# Patient Record
Sex: Female | Born: 1985 | Race: Black or African American | Marital: Single | Smoking: Current every day smoker
Health system: Northeastern US, Academic
[De-identification: ages and names within clinical notes are randomized; demographics above are authoritative.]

## PROBLEM LIST (undated history)

## (undated) DIAGNOSIS — Z789 Other specified health status: Secondary | ICD-10-CM

## (undated) DIAGNOSIS — J302 Other seasonal allergic rhinitis: Secondary | ICD-10-CM

## (undated) DIAGNOSIS — Z8619 Personal history of other infectious and parasitic diseases: Secondary | ICD-10-CM

## (undated) DIAGNOSIS — T1490XA Injury, unspecified, initial encounter: Secondary | ICD-10-CM

## (undated) DIAGNOSIS — M419 Scoliosis, unspecified: Secondary | ICD-10-CM

## (undated) DIAGNOSIS — Z62810 Personal history of physical and sexual abuse in childhood: Secondary | ICD-10-CM

## (undated) DIAGNOSIS — A6009 Herpesviral infection of other urogenital tract: Secondary | ICD-10-CM

## (undated) DIAGNOSIS — A64 Unspecified sexually transmitted disease: Secondary | ICD-10-CM

## (undated) DIAGNOSIS — A6000 Herpesviral infection of urogenital system, unspecified: Secondary | ICD-10-CM

## (undated) HISTORY — DX: Herpesviral infection of urogenital system, unspecified: A60.00

## (undated) HISTORY — DX: Unspecified sexually transmitted disease: A64

## (undated) HISTORY — PX: TUBAL LIGATION: SHX77

## (undated) HISTORY — DX: Personal history of other infectious and parasitic diseases: Z86.19

## (undated) HISTORY — DX: Scoliosis, unspecified: M41.9

## (undated) HISTORY — DX: Injury, unspecified, initial encounter: T14.90XA

## (undated) HISTORY — DX: Personal history of physical and sexual abuse in childhood: Z62.810

## (undated) HISTORY — DX: Herpesviral infection of other urogenital tract: A60.09

## (undated) HISTORY — DX: Other seasonal allergic rhinitis: J30.2

## (undated) HISTORY — DX: Other specified health status: Z78.9

---
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
## 2006-04-07 DIAGNOSIS — Z13228 Encounter for screening for other metabolic disorders: Secondary | ICD-10-CM

## 2006-04-07 HISTORY — DX: Encounter for screening for other metabolic disorders: Z13.228

## 2007-04-08 DIAGNOSIS — A749 Chlamydial infection, unspecified: Secondary | ICD-10-CM

## 2007-04-08 HISTORY — DX: Chlamydial infection, unspecified: A74.9

## 2009-06-15 ENCOUNTER — Ambulatory Visit: Payer: Self-pay | Admitting: Obstetrics and Gynecology

## 2009-06-29 ENCOUNTER — Ambulatory Visit: Payer: Self-pay | Admitting: Obstetrics and Gynecology

## 2009-07-09 ENCOUNTER — Ambulatory Visit: Payer: Self-pay | Admitting: Obstetrics and Gynecology

## 2009-07-09 ENCOUNTER — Ambulatory Visit
Admit: 2009-07-09 | Discharge: 2009-07-09 | Disposition: A | Discharge status: Home / Self Care | Payer: Self-pay | Source: Ambulatory Visit | Attending: Obstetrics and Gynecology | Admitting: Obstetrics and Gynecology

## 2009-07-12 LAB — N. GONORRHOEAE DNA AMPLIFICATION

## 2009-07-12 LAB — CHLAMYDIA PLASMID DNA AMPLIFICATION

## 2009-07-27 ENCOUNTER — Encounter: Payer: Self-pay | Admitting: Internal Medicine

## 2009-08-01 ENCOUNTER — Encounter: Payer: Self-pay | Admitting: Gastroenterology

## 2009-08-09 ENCOUNTER — Ambulatory Visit: Payer: Self-pay | Admitting: Dermatology

## 2009-08-17 ENCOUNTER — Ambulatory Visit: Payer: Self-pay | Admitting: Obstetrics and Gynecology

## 2009-08-30 ENCOUNTER — Ambulatory Visit: Payer: Self-pay | Admitting: Dermatology

## 2009-09-08 NOTE — Progress Notes (Addendum)
Progress notes   Consulting Physician: [facility].  CC:  Psoriasis.  HPI:  24 year old Afro-American female here for evaluation and treatment   options for what she believes is psoriasis.  She sent in consultation from   the [facility].  Her mother has psoriasis.  Her mother has   received injection therapy for this.  She states that the lesions were   itchy, however use of moisturizer has helped that.     ROS:   Otherwise well.   --No other skin concerns      Past Medical/Surgical History:  Intake form reviewed     Family History:    Intake form reviewed        Social History:    Intake form reviewed        Exam:    --Well-nourished, well-developed, no acute distress  --All of the following were examined and were within normal limits, except   as noted:     -- Face, Neck, Scalp: There are small acneiform papules on the forehead.   -- Chest, Back, Abdomen:      --Right/Left Arms/Hands:  There are small follicular hyperkeratotic papules   on the posterior aspects of both arms.  --Right/Left Legs/Feet: 2 scaly pink plaques on the left knee.  There are   small scaly pink papules within the patient's tattoo on the left side.     Barriers to learning:  None    Assessment/Plan:   1.  Plaque psoriasis, very limited involvement.  Diagnosis and treatment options discussed with the patient.  Triamcinolone 0.1% ointment to be applied twice daily to any affected area.  Patient will followup in one month to make sure that she is doing well.     2.  Keratosis pilaris on the arms.  Diagnosis and treatment options discussed with the patient.  Recommend AmLactin or Lac-Hydrin cream to be applied once to twice daily.    Discussed with the patient that this likely will recur upon discontinuation   of the medications.     3.  Acne on the forehead.  The patient uses hair grease on her frontal scalp regularly, which is   likely causing his acne.  She is not particularly bothered by it.  Treatment will be deferred  at this   time.  Vital Signs   Recorded by HATHAWAY,MARIELLE on 30 Aug 2009 02:58 PM  BP:120/72,   Pain Scale: 0.  Current Meds   MetroNIDAZOLE 500 MG Tablet;take 1 tablet by mouth twice a day for 7 days;   RPT  Doxycycline Hyclate 100 MG Capsule;take 1 capsule by mouth twice a day for   7 days; RPT  Valtrex 500 MG Tablet;TABLET BY MOUTH TWICE A DAY; RPT  Tindamax 500 MG Tablet;TAKE 4 TABLETS BY MOUTH EVERY DAY FOR 2 DAYS; RPT.  Attestation   I saw and evaluated the patient.  I agree with the resident's findings and   plan of care as documented above.         [place], 09/08/2009,  1:17PM.  Signature   Electronically signed by: Miquel Dunn  MD - Res.; 08/30/2009 3:36 PM   EST.  Electronically signed by: Hermine Messick  MD Attend.; 09/08/2009 1:17 PM   EST.

## 2009-09-23 ENCOUNTER — Ambulatory Visit
Admit: 2009-09-23 | Discharge: 2009-09-23 | Disposition: A | Discharge status: Home / Self Care | Payer: Self-pay | Source: Ambulatory Visit | Attending: Obstetrics and Gynecology | Admitting: Obstetrics and Gynecology

## 2009-09-23 ENCOUNTER — Ambulatory Visit: Payer: Self-pay | Admitting: Obstetrics and Gynecology

## 2009-09-23 DIAGNOSIS — Z6835 Body mass index (BMI) 35.0-35.9, adult: Secondary | ICD-10-CM

## 2009-09-23 HISTORY — DX: Body mass index (BMI) 35.0-35.9, adult: Z68.35

## 2009-09-23 LAB — TSH: TSH: 1.57 u[IU]/mL (ref 0.27–4.20)

## 2009-09-24 LAB — N. GONORRHOEAE DNA AMPLIFICATION: N. gonorrhoeae DNA Amplification: 0

## 2009-09-24 LAB — RPR: RPR Screen: NONREACTIVE

## 2009-09-24 LAB — CHLAMYDIA PLASMID DNA AMPLIFICATION: Chlamydia Plasmid DNA Amplification: 0

## 2009-09-27 LAB — VITAMIN D
25-OH VIT D2: <4 ng/mL
25-OH VIT D3: 27 ng/mL
25-OH Vit Total: 27 ng/mL — ABNORMAL LOW (ref 30–80)

## 2009-09-28 LAB — GYN CYTOLOGY

## 2009-10-11 ENCOUNTER — Ambulatory Visit: Payer: Self-pay | Admitting: Dermatology

## 2009-10-22 ENCOUNTER — Ambulatory Visit: Payer: Self-pay | Admitting: Orthopedic Surgery

## 2009-11-12 ENCOUNTER — Ambulatory Visit: Payer: Self-pay | Admitting: Obstetrics and Gynecology

## 2010-01-24 ENCOUNTER — Ambulatory Visit: Payer: Self-pay | Admitting: Obstetrics and Gynecology

## 2010-02-11 ENCOUNTER — Ambulatory Visit: Payer: Self-pay | Admitting: Obstetrics and Gynecology

## 2010-03-07 ENCOUNTER — Ambulatory Visit
Admit: 2010-03-07 | Discharge: 2010-03-07 | Disposition: A | Discharge status: Home / Self Care | Payer: Self-pay | Source: Ambulatory Visit | Attending: Obstetrics and Gynecology | Admitting: Obstetrics and Gynecology

## 2010-03-07 ENCOUNTER — Ambulatory Visit: Payer: Self-pay | Admitting: Obstetrics and Gynecology

## 2010-03-08 LAB — N. GONORRHOEAE DNA AMPLIFICATION: N. gonorrhoeae DNA Amplification: 0

## 2010-03-08 LAB — CHLAMYDIA PLASMID DNA AMPLIFICATION: Chlamydia Plasmid DNA Amplification: 0

## 2010-04-18 ENCOUNTER — Ambulatory Visit: Payer: Self-pay | Admitting: Orthopedic Surgery

## 2010-04-26 ENCOUNTER — Ambulatory Visit: Payer: Self-pay | Admitting: Orthopedic Surgery

## 2010-05-04 ENCOUNTER — Ambulatory Visit: Payer: Self-pay | Admitting: Certified Nurse Midwife

## 2010-05-12 ENCOUNTER — Ambulatory Visit: Payer: Self-pay | Admitting: Advanced Practice Midwife

## 2010-05-15 NOTE — Miscellaneous (Unsigned)
Continuity of Care Record  Created: todo  From: ,   From:   From: [employer], EHR v10.2.7.53  To: Saxer, Tyjae  Purpose: Patient Use;       Medications  Doxycycline Hyclate 100 MG Capsule; take 1 capsule by mouth twice a day for   7 days ; RPT   MetroNIDAZOLE 500 MG Tablet; take 1 tablet by mouth twice a day for 7 days   ; RPT   Tindamax 500 MG Tablet; TAKE 4 TABLETS BY MOUTH EVERY DAY FOR 2 DAYS ; RPT   Triamcinolone Acetonide 0.1 % Ointment; APPLY SPARINGLY AND RUB IN WELL TO   AFFECTED AREA(S) TWICE DAILY. NOT FOR FACE, BREAST OR GROIN. ; Rx   Valtrex 500 MG Tablet; TABLET BY MOUTH TWICE A DAY ; RPT

## 2010-05-19 ENCOUNTER — Ambulatory Visit: Payer: Self-pay | Admitting: Advanced Practice Midwife

## 2010-06-17 ENCOUNTER — Ambulatory Visit: Payer: Self-pay | Admitting: Orthopedic Surgery

## 2010-06-24 ENCOUNTER — Ambulatory Visit: Payer: Self-pay | Admitting: Orthopedic Surgery

## 2010-06-25 NOTE — Progress Notes (Signed)
 Regina Gates is now 25 years of age.  She comes in for follow-up of scoliosis and  back pain. 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  She has a son at home.  The patient reports  that she has pain in her middle back and also pain in the left side of her  low back. The pain does not radiate.  She has been otherwise healthy.  She  is not taking any regular medications.  She does smoke but understands that  she should quit to protect a number of aspects of her health. He pain does  not radiate.    PHYSICAL EXAMINATION: Shoulders and pelvis are level on forward bending.  She does not have a prominent rotational abnormality. She did not have  tenderness to palpation.    X-RAYS: X-rays were taken today and compared with x-rays taken in the past.  This curve has not changed. I measured her curve today from T5 to T11 is 28  degrees to the right, curve from T11 to L4 37 degrees to the left.    The patient and I discussed that the best thing to do for her back pain  probably as a first step are some anti-inflammatory medications.  I  provided her a prescription for naproxen.  I have also asked her to do some  physical therapy and I have provided her with a prescription.  The patient  indicates to me that she has tried to get a breast reduction done through  her insurance and Dr. Daron Offer.  This apparently was refused.  I  explained to her that this was outside my area of expertise.  I did suggest  that it would certainly be valuable to try to lose weight and do therapy  and that this might allow her to make a better argument with her insurance  company. She understands it would probably be very valuable for her to get  a primary care physician so she could pursue this agenda with the primary  care's help. At this point, I do not have specific further recommendations.  I left it that that comes back on an as needed basis.                Electronically Signed and  Finalized  by  Danton Sewer, MD 06/27/2010 11:31  ___________________________________________  Danton Sewer, MD      DD:   06/24/2010  DT:   06/25/2010 12:03 P  ZOX/[id number]  [id number]    cc:

## 2010-07-05 ENCOUNTER — Ambulatory Visit: Payer: Self-pay | Admitting: Obstetrics and Gynecology

## 2010-07-19 ENCOUNTER — Ambulatory Visit: Payer: Self-pay | Admitting: Obstetrics and Gynecology

## 2010-09-29 ENCOUNTER — Ambulatory Visit: Payer: Self-pay | Admitting: Obstetrics and Gynecology

## 2010-10-14 ENCOUNTER — Telehealth: Payer: Self-pay

## 2010-10-14 NOTE — Telephone Encounter (Signed)
 T.C. From pt requesting an annual and was informed appts are being booked in July. Pt then requesting gyn office visit because of pain with IC. Pt agreeable to go to any office there is an opening. Call transferred to sec to schedule next available appt.

## 2010-12-13 ENCOUNTER — Encounter: Payer: Self-pay | Admitting: Obstetrics and Gynecology

## 2010-12-13 DIAGNOSIS — Z975 Presence of (intrauterine) contraceptive device: Secondary | ICD-10-CM

## 2010-12-15 DIAGNOSIS — B009 Herpesviral infection, unspecified: Secondary | ICD-10-CM | POA: Insufficient documentation

## 2010-12-16 ENCOUNTER — Ambulatory Visit: Payer: Self-pay | Admitting: Obstetrics and Gynecology

## 2010-12-16 ENCOUNTER — Encounter: Payer: Self-pay | Admitting: Obstetrics and Gynecology

## 2010-12-16 VITALS — BP 90/60 | Ht 67.0 in | Wt 240.0 lb

## 2010-12-16 DIAGNOSIS — Z01419 Encounter for gynecological examination (general) (routine) without abnormal findings: Secondary | ICD-10-CM

## 2010-12-16 DIAGNOSIS — N760 Acute vaginitis: Secondary | ICD-10-CM

## 2010-12-16 DIAGNOSIS — Z30431 Encounter for routine checking of intrauterine contraceptive device: Secondary | ICD-10-CM

## 2010-12-16 DIAGNOSIS — B9689 Other specified bacterial agents as the cause of diseases classified elsewhere: Secondary | ICD-10-CM

## 2010-12-16 MED ORDER — METRONIDAZOLE 500 MG PO TABS *I*
500.0000 mg | ORAL_TABLET | Freq: Two times a day (BID) | ORAL | Status: AC
Start: 2010-12-16 — End: 2010-12-23
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
## 2010-12-16 NOTE — Patient Instructions (Signed)
 What is bacterial vaginosis?  Bacterial Vaginosis (BV) is the name of a condition in women where the normal balance of bacteria in the vagina is disrupted and replaced by an overgrowth of certain bacteria. It is sometimes accompanied by discharge, odor, pain, itching, or burning.    How common is bacterial vaginosis?  Bacterial Vaginosis (BV) is the most common vaginal infection in women of childbearing age. In the [place], BV is common in pregnant women.    How do people get bacterial vaginosis?  The cause of BV is not fully understood. BV is associated with an imbalance in the bacteria that are normally found in a woman's vagina. The vagina normally contains mostly "good" bacteria, and fewer "harmful" bacteria. BV develops when there is an increase in harmful bacteria.   Not much is known about how women get BV. There are many unanswered questions about the role that harmful bacteria play in causing BV. Any woman can get BV. However, some activities or behaviors can upset the normal balance of bacteria in the vagina and put women at increased risk including:   . Having a new sex partner or multiple sex partners   . Douching  It is not clear what role sexual activity plays in the development of BV. Women do not get BV from toilet seats, bedding, swimming pools, or from touching objects around them. Women who have never had sexual intercourse may also be affected.    What are the signs and symptoms of bacterial vaginosis?  Women with BV may have an abnormal vaginal discharge with an unpleasant odor. Some women report a strong fish-like odor, especially after intercourse. Discharge, if present, is usually white or gray; it can be thin. Women with BV may also have burning during urination or itching around the outside of the vagina, or both. However, most women with BV report no signs or symptoms at all.    What are the complications of bacterial vaginosis?   In most cases, BV causes no complications. But there  are some serious risks from BV including:   . Having BV can increase a woman's susceptibility to HIV infection if she is exposed to the HIV virus.  . Having BV increases the chances that an HIV-infected woman can pass HIV to her sex partner.  . Having BV has been associated with an increase in the development of an infection following surgical procedures such as a hysterectomy or an abortion.  . Having BV while pregnant may put a woman at increased risk for some complications of pregnancy, such as a preterm delivery.  . BV can increase a woman's susceptibility to other STDs, such as herpes simplex virus (HSV), chlamydia and gonorrhea.    How does bacterial vaginosis affect a pregnant woman and her baby?   Pregnant women with BV more often have babies who are born premature or with low birth weight (low birth weight is less than 5.5 pounds).  The bacteria that cause BV can sometimes infect the uterus (womb) and fallopian tubes (tubes that carry eggs from the ovaries to the uterus). This type of infection is called pelvic inflammatory disease (PID). PID can cause infertility or damage the fallopian tubes enough to increase the future risk of ectopic pregnancy and infertility. Ectopic pregnancy is a life-threatening condition in which a fertilized egg grows outside the uterus, usually in a fallopian tube which can rupture.     How is bacterial vaginosis diagnosed?   A health care provider must examine the  vagina for signs of BV and perform laboratory tests on a sample of vaginal fluid to look for bacteria associated with BV.    What is the treatment for bacterial vaginosis?  Although BV will sometimes clear up without treatment, all women with symptoms of BV should be treated to avoid complications. Female partners generally do not need to be treated. However, BV may spread between female sex partners.  Treatment is especially important for pregnant women. All pregnant women who have ever had a premature delivery or low  birth weight baby should be considered for a BV examination, regardless of symptoms, and should be treated if they have BV. All pregnant women who have symptoms of BV should be checked and treated.  Some physicians recommend that all women undergoing a hysterectomy or abortion be treated for BV prior to the procedure, regardless of symptoms, to reduce their risk of developing an infection.  BV is treatable with antibiotics prescribed by a health care provider. Two different antibiotics are recommended as treatment for BV: metronidazole or clindamycin. Either can be used with non-pregnant or pregnant women, but the recommended dosages differ. Women with BV who are HIV-positive should receive the same treatment as those who are HIV-negative.  BV can recur after treatment.    How can bacterial vaginosis be prevented?   BV is not completely understood by scientists, and the best ways to prevent it are unknown. However, it is known that BV is associated with having a new sex partner or having multiple sex partners.  The following basic prevention steps can help reduce the risk of upsetting the natural balance of bacteria in the vagina and developing BV:  . Be abstinent.  . Limit the number of sex partners.  . Do not douche.  . Use all of the medicine prescribed for treatment of BV, even if the signs and symptoms go away.    FOR MORE INFORMATION:   Division of STD Prevention (DSTDP) Centers for Disease Control and Prevention  SolutionApps.co.za     CDC-INFO Contact Center 1-800-CDC-INFO 340-574-2170)   email: cdcinfo@cdc .gov     Website: FootballExhibition.com.br     Reviewed 10/2011MIRENA IUD (The Levonorgestrel Intrauterine System)    What is the levonorgestrel intrauterine system?  The levonorgestrel intrauterine system (IUS) (Mirena) is a new intrauterine contraceptive approved by the [place] Food and Drug Administration in December 2000. The contraceptive is quite small (about 11/4" tall and wide), made of plastic, and shaped like the  letter "T" (see photo). Inside, it contains a synthetic female hormone, levonorgestrel, which is released slowly into the uterus (womb), helping to prevent pregnancy. This hormone acts like the natural hormone progesterone and is widely used in implants and oral contraceptives.    How thoroughly was the product tested?  The levonorgestrel IUS has been available in [place] for more than 10 years. Worldwide, the IUS is available in more than 50 countries. Almost 2 million women have used this intrauterine contraceptive.    How does the system work?  Scientists believe that intrauterine contraceptives work in many ways, all of which are not fully understood. Research suggests that contraceptives placed inside the uterus work mainly by preventing fertilization, interfering with the normal development of the egg and the sperm's ability to reach and penetrate the egg. The hormone in the IUS also helps prevent the release of an egg and makes the cervical mucus thick. Stated another way, this method is a true contraceptive: it prevents pregnancy from occurring.    How  effective is the IUS?  The IUS contraceptive is one of the most effective reversible contraceptives on the market today. During the first year of use, only 0.1% of women using the method will become pregnant. Over a period of 5 years, fewer than 1% of users will get pregnant. This makes the levonorgestrel IUS as effective as female and female sterilization--with the advantage of being reversible.    Are there side effects?  Changes in menstrual bleeding patterns are the most common side effects of the IUS. During the first 3 to 6 months of use, the number of days of bleeding and spotting increases and bleeding patterns become irregular.  After 3 to 6 months, however, bleeding and spotting usually lessen quite a bit. Women may have only 10% of their usual blood loss during their periods. About 20% of women will have no bleeding after 12 months, a side effect many  women like. These bleeding changes are not harmful and have the benefit of making women less likely to be anemic. Other side effects include lower abdominal pain or cramping, reported by about 10% of

## 2010-12-19 LAB — CHLAMYDIA PLASMID DNA AMPLIFICATION: Chlamydia Plasmid DNA Amplification: 0

## 2010-12-19 LAB — N. GONORRHOEAE DNA AMPLIFICATION: N. gonorrhoeae DNA Amplification: 0

## 2010-12-21 LAB — GYN CYTOLOGY

## 2010-12-26 ENCOUNTER — Telehealth: Payer: Self-pay | Admitting: Obstetrics and Gynecology

## 2010-12-26 NOTE — Telephone Encounter (Signed)
TC to pt to f/u 12/16/10 annual gyn visit. Pap negative and has no h/o abnormal pap. Plan repap in 1 yr.  Also, CT/GC cx negative.  Pt has no questions and will f/u in 1 yr or prn.

## 2011-01-04 ENCOUNTER — Encounter: Payer: Self-pay | Admitting: Obstetrics and Gynecology

## 2011-01-04 ENCOUNTER — Other Ambulatory Visit: Payer: Self-pay | Admitting: Obstetrics and Gynecology

## 2011-01-04 DIAGNOSIS — Z3169 Encounter for other general counseling and advice on procreation: Secondary | ICD-10-CM

## 2011-01-04 DIAGNOSIS — B009 Herpesviral infection, unspecified: Secondary | ICD-10-CM

## 2011-01-04 MED ORDER — CALCIUM CARBONATE-VITAMIN D 600-400 MG-UNIT PO TABS *WRAPPED*
1.0000 | ORAL_TABLET | Freq: Two times a day (BID) | ORAL | Status: DC | PRN
Start: 2011-01-04 — End: 2011-09-26

## 2011-01-04 MED ORDER — PRENAVITE PROTEIN COATED 28-0.8 MG PO TABS *A*
1.0000 | ORAL_TABLET | Freq: Every day | ORAL | Status: DC
Start: 2011-01-04 — End: 2012-02-13

## 2011-01-04 NOTE — Progress Notes (Signed)
TC from pt.   Has decided she desires pregnancy & has scheduled IUD removal 02/02/11.  P: Rev'd keeping menstrual calendar after IUD removal for ovulation awareness & pregnancy dating.  Rx PN Vits.  Pt requests Rx Calcium supplement as has no dietary sources. Rx made & discussed dietary sources.  Rev'd avoidance of social teratogens.  Discussed OB risks including BMI 33 & HSV2 transmission in pregnancy  with plan for Rx suppression late pregnancy. Pt requests Dx HSV2 not be discussed with FOB.

## 2011-01-30 ENCOUNTER — Encounter: Payer: Self-pay | Admitting: Obstetrics and Gynecology

## 2011-02-02 ENCOUNTER — Encounter: Payer: Self-pay | Admitting: Obstetrics and Gynecology

## 2011-02-02 ENCOUNTER — Ambulatory Visit
Admit: 2011-02-02 | Discharge: 2011-02-02 | Disposition: A | Discharge status: Home / Self Care | Payer: Self-pay | Source: Ambulatory Visit | Admitting: Obstetrics and Gynecology

## 2011-02-02 ENCOUNTER — Ambulatory Visit: Payer: Self-pay | Admitting: Obstetrics and Gynecology

## 2011-02-02 VITALS — BP 120/75 | Ht 67.0 in | Wt 245.0 lb

## 2011-02-02 DIAGNOSIS — Z30432 Encounter for removal of intrauterine contraceptive device: Secondary | ICD-10-CM

## 2011-02-02 DIAGNOSIS — B009 Herpesviral infection, unspecified: Secondary | ICD-10-CM

## 2011-02-02 MED ORDER — VALTREX 500 MG PO TABS
500.0000 mg | ORAL_TABLET | Freq: Two times a day (BID) | ORAL | Status: AC
Start: 2011-02-02 — End: 2011-02-07
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
## 2011-03-07 ENCOUNTER — Ambulatory Visit: Payer: Self-pay | Admitting: Advanced Practice Midwife

## 2011-03-27 ENCOUNTER — Ambulatory Visit: Payer: Self-pay | Admitting: Obstetrics and Gynecology

## 2011-03-27 ENCOUNTER — Encounter: Payer: Self-pay | Admitting: Obstetrics and Gynecology

## 2011-03-27 VITALS — BP 96/64 | Ht 67.0 in | Wt 246.0 lb

## 2011-03-27 DIAGNOSIS — Z3009 Encounter for other general counseling and advice on contraception: Secondary | ICD-10-CM | POA: Insufficient documentation

## 2011-03-27 MED ORDER — LEVONORGESTREL-ETHINYL ESTRAD 0.1-20 MG-MCG PO TABS *I*
1.0000 | ORAL_TABLET | Freq: Every day | ORAL | Status: DC
Start: 2011-03-27 — End: 2011-09-26

## 2011-03-27 NOTE — Patient Instructions (Signed)
HOW TO TAKE BIRTH CONTROL PILLS    HOW THEY WORK: The major effect of the pill is to prevent ovulation, or release of an egg.    A. DIRECTIONS FOR TAKING THE PILLS    You may start taking your first pack of pills 3 weeks after your delivery but not sooner.       Use foam and condoms or another back-up method to protect you from pregnancy during your first month on the pill and with antibiotics. (see below)    Following cycles: After you have completed your first packet of pills, you begin another packet the following day.  On a 28-day pill regimen you take a pill every day.  As soon as one packet of 28 pills has been completed, begin a new packet.  Take the pill at approximately the same time each day so that you form a habit of regularity in taking them, and to keep a steady level of hormones in your system.    B. PROTECTION    Unless otherwise informed by your care provider, you may assume that you are protected from pregnancy after you have completed your first cycle of pills.  Your protection from then on is continuous, including the time you are having your period.  If you become ill and have several days of severe diarrhea, or if you are taking antibiotics, use foam and condoms or another backup method for the remainder of that cycle.       C. MENSTRUAL PERIODS    You may expect your period to begin sometime during the last seven days of each packet of pills.  The hormones in the pill produce menstrual periods less in amount and duration.  This should not be a cause for concern.    D. MINOR PROBLEMS YOU MAY EXPERIENCE AS SIDE EFFECTS OF THE PILL (CHANGES IN HORMONE LEVELS)    1. Spotting (breakthrough bleeding): It is caused by the slight change of hormone levels caused by the pill and it usually stops after 1-2 cycles.  Continue taking your pills - they will still be effective.  Call us if the problem does not resolve by the third pack of pills.    If the amount of bleeding is less than that of a normal  period, continue taking your pills as usual until the time of your first check-up.    2. Delayed or missed Period: A delayed or missed period does not necessarily mean that you are pregnant.  Call your care provider if you miss a period and have missed one or more pills.  If you have not missed any pills and no period occurs during the last 7 days of a packet, begin a new packet as usual.  If no period occurs by the end of the second packet, do not begin a third packet.  Use foam and condoms to protect you from pregnancy, do a home pregnancy test or call for an appointment in the office.    3. Missed Pills: If you miss only one pill, take it as soon as you remember it, even though this may mean you have to take two pills on the same day.  You will probably still be protected from pregnancy.    If you miss two pills, take two pills as soon as you remember and two the next day.  Use your backup method until your next period.  You may experience irregular breakthrough bleeding throughout the remainder of the cycle as a   result of the change in the usual hormone levels.    If you forget one or more pills and miss a period, stop taking the pill and use a backup method of birth control.  You need a pregnancy test before continuing your pills.    4. Nausea and Vomiting, Weight Gain, Breast Tenderness: Although annoying, they are not dangerous and they can be expected to stop after the first few cycles.  If you experience nausea, try taking your pill at bedtime or with food or milk.    5. Vaginal Itching and Discharge: Some women taking oral contraceptives seem more likely to get certain vaginal infections.  Although these symptoms are annoying they are not dangerous since such infections are readily diagnosed and treated without difficulty.  Call for an appointment.    E. THE FOLLOWING RARE SYMPTOMS REQUIRE THAT YOU STOP THE PILL AND GET PROMPT MEDICAL ATTENTION    SEVERE HEADACHE, SEVERE LEG CRAMPS, SEVERE ABDOMINAL PAIN,  CHEST PAIN, BLURRED VISION:  These are danger signals, which can indicate problems.  If you experience any of these symptoms, STOP TAKING THE PILL IMMEDIATELY.  Call the office for an appointment.  In the meantime, use foam and condoms or another backup method for protection.  Do not be alarmed by irregular bleeding after you have stopped the pills.  This is an expected result of stopping the pills without completing a cycle.    If you smoke more than 14 cigarettes a day, watch more carefully for pill danger signals.  Smokers should probably stop taking birth control pills at age 35.  Even better, STOP SMOKING.  You can talk to us about methods to help you quit.    Be sure to mention that you are taking birth control pills anytime you are hospitalized or seen by a doctor or other care provider.    F. If you miss two periods in a row, do a pregnancy test, even if you took your pills every day.    If you have any additional problems not covered here, do not hesitate to call the Midwifery office at [phone] for further assistance.          Revised: February 2001

## 2011-03-27 NOTE — Progress Notes (Signed)
Here alone.  Had initially scheduled UPT, but got her period yesterday and also did home UPT which was negative. Is no longer with previous partner, no longer desires pregnancy.  Would like to start contraception- ocps.      Discussed options.  Pt does smoke a few cigarettes/day, is working on quitting.    Filed Vitals:    03/27/11 0918   BP: 96/64   Height: 5\' 7"  (1.702 m)   Weight: 246 lb (111.585 kg)   LMP 03/26/11.      A/P:  25 y/o G1P1 who desires contraception.  Morbid obesity.    Warnings re: smoking, obesity thoroughly reviewed with pt starting COCs.  Desires ocp start, aware of risks/benefits.  BP wnl today, will have BP check in 1 month.  Rx for Alesse to pharmacy.  OCP handout given.    Kerby Less, CNM

## 2011-04-18 ENCOUNTER — Telehealth: Payer: Self-pay

## 2011-04-18 DIAGNOSIS — B009 Herpesviral infection, unspecified: Secondary | ICD-10-CM

## 2011-04-18 MED ORDER — VALACYCLOVIR HCL 1000 MG PO TABS *I*
1000.0000 mg | ORAL_TABLET | Freq: Every day | ORAL | Status: AC | PRN
Start: 2011-04-18 — End: 2011-04-23

## 2011-04-24 ENCOUNTER — Ambulatory Visit: Payer: Self-pay | Admitting: Obstetrics and Gynecology

## 2011-04-24 ENCOUNTER — Encounter: Payer: Self-pay | Admitting: Obstetrics and Gynecology

## 2011-04-24 VITALS — BP 100/66 | Ht 67.0 in | Wt 245.0 lb

## 2011-04-24 DIAGNOSIS — IMO0001 Reserved for inherently not codable concepts without codable children: Secondary | ICD-10-CM

## 2011-04-24 DIAGNOSIS — Z3009 Encounter for other general counseling and advice on contraception: Secondary | ICD-10-CM

## 2011-04-24 LAB — POCT URINE PREGNANCY
Exp date: 2
Lot #: 206830

## 2011-05-08 ENCOUNTER — Ambulatory Visit: Payer: Self-pay | Admitting: Obstetrics and Gynecology

## 2011-05-08 ENCOUNTER — Encounter: Payer: Self-pay | Admitting: Obstetrics and Gynecology

## 2011-05-16 ENCOUNTER — Telehealth: Payer: Self-pay

## 2011-05-16 DIAGNOSIS — Z3009 Encounter for other general counseling and advice on contraception: Secondary | ICD-10-CM

## 2011-05-16 MED ORDER — LEVONORGESTREL 0.75 MG PO TABS *I*
0.7500 mg | ORAL_TABLET | Freq: Two times a day (BID) | ORAL | Status: DC
Start: 2011-05-16 — End: 2011-09-26

## 2011-06-06 ENCOUNTER — Ambulatory Visit: Payer: Self-pay | Admitting: Obstetrics and Gynecology

## 2011-07-17 ENCOUNTER — Ambulatory Visit: Payer: Self-pay | Admitting: Certified Nurse Midwife

## 2011-08-08 ENCOUNTER — Encounter: Payer: Self-pay | Admitting: Obstetrics and Gynecology

## 2011-08-08 DIAGNOSIS — Z0000 Encounter for general adult medical examination without abnormal findings: Secondary | ICD-10-CM | POA: Insufficient documentation

## 2011-08-15 ENCOUNTER — Ambulatory Visit: Payer: Self-pay | Admitting: Obstetrics and Gynecology

## 2011-09-08 ENCOUNTER — Telehealth: Payer: Self-pay

## 2011-09-08 DIAGNOSIS — Z202 Contact with and (suspected) exposure to infections with a predominantly sexual mode of transmission: Secondary | ICD-10-CM

## 2011-09-08 MED ORDER — AZITHROMYCIN 250 MG PO TABS *I*
1000.0000 mg | ORAL_TABLET | Freq: Once | ORAL | Status: AC
Start: 2011-09-08 — End: 2011-09-08

## 2011-09-08 NOTE — Telephone Encounter (Signed)
09/08/2011  Called and left message for pt to pick up prescription for treatment.  Advised to call if she had any other concerns or questions.  Jeani Sow, RN

## 2011-09-08 NOTE — Telephone Encounter (Signed)
09/08/2011  Pt stating that she would like medication because she has been exposed to CT.  Her boyfriend received a call today from the doctors stating that he is positive for CT.  Pt would like medication to treat.  Told pt that the information would be passed on to the providers and that she will receie a call back notifying her of the plan.   Jeani Sow, RN

## 2011-09-26 ENCOUNTER — Ambulatory Visit: Payer: Self-pay | Admitting: Obstetrics and Gynecology

## 2011-09-26 ENCOUNTER — Encounter: Payer: Self-pay | Admitting: Obstetrics and Gynecology

## 2011-09-26 VITALS — BP 121/88 | Ht 67.0 in | Wt 254.0 lb

## 2011-09-26 DIAGNOSIS — Z708 Other sex counseling: Secondary | ICD-10-CM

## 2011-09-26 LAB — POCT URINE PREGNANCY: Lot #: 209471

## 2011-09-26 MED ORDER — METRONIDAZOLE 500 MG PO TABS *I*
500.0000 mg | ORAL_TABLET | Freq: Two times a day (BID) | ORAL | Status: AC
Start: 2011-09-26 — End: 2011-10-03

## 2011-09-26 NOTE — Addendum Note (Signed)
Addended by: Nedra Hai on: 09/26/2011 11:14 AM     Modules accepted: Orders

## 2011-09-26 NOTE — Addendum Note (Signed)
Addended by: Nedra Hai on: 09/26/2011 11:17 AM     Modules accepted: Orders

## 2011-09-26 NOTE — Progress Notes (Signed)
GYN Visit  S: Regina Gates is a 26 y.o. G1P1001 who presents for an STD check.  Pt has the following symptoms: none  Pt is has sex with males Together with partner x 5 years   current sexual partner thought to have history of chlamydia STD History past history: CHL, GC.  Currently no contraception and does not want any. Desires UPT    Patient's medications, allergies, past medical, surgical, social and family histories were reviewed and updated as appropriate.    GYN Hx:   Patient's last menstrual period was 08/26/2011.  Menstrual Cycles: regular every 28-30 days   Hx STIs: GC CHL    Pap smear history:      -Date of most recent pap smear: 12/16/10 ; Result: neg   -Last annual 12/16/11     PE:   Filed Vitals:    09/26/11 1019   BP: 121/88   Height: 5\' 7"  (1.702 m)   Weight: 254 lb (115.214 kg)       General: alert and oriented, in no acute distress, obese  Abdomen: soft, non-tender; bowel sounds normal; no masses,  no organomegaly  Pelvic: normal external genitalia, vulva, vagina, cervix, uterus and adnexa    Wet Prep: not done      A: 26 y.o. female G1P1001 presenting to the office for an acute GYN visit for STD screen and UPT.  No diagnosis found.     P:  1. STI screening: GC, chlamydia, trichomonas, BV, yeast  2. Contraception: none   3. Lab tests/imaging:UPT neg  4. Referrals:none indicated   5. Problem list reviewed and updated.   6. Reviewed when & how to call, RTO in: 3 months for annual and prn

## 2011-09-27 ENCOUNTER — Other Ambulatory Visit: Payer: Self-pay | Admitting: Obstetrics and Gynecology

## 2011-09-27 LAB — VAGINITIS SCREEN: DNA PROBE: Vaginitis Screen:DNA Probe: POSITIVE

## 2011-09-27 LAB — CHLAMYDIA PLASMID DNA AMPLIFICATION: Chlamydia Plasmid DNA Amplification: 0

## 2011-09-27 LAB — N. GONORRHOEAE DNA AMPLIFICATION: N. gonorrhoeae DNA Amplification: 0

## 2011-11-07 ENCOUNTER — Other Ambulatory Visit: Payer: Self-pay | Admitting: Obstetrics and Gynecology

## 2011-11-08 NOTE — Telephone Encounter (Signed)
PC to pt to confirm need for refill on OCP's. Due for annual 8/13

## 2011-11-10 ENCOUNTER — Telehealth: Payer: Self-pay

## 2011-11-10 NOTE — Telephone Encounter (Signed)
RTC from pt, informed her of the need to schedule her annual appt.

## 2011-11-10 NOTE — Telephone Encounter (Signed)
Attempt to call pt. Left message on the pt's voice mail to call the nurses' line back, phone number provided.     An e-request from the pharmacy for the pt's Aviane was received for the pt, Regina Gates is due for her annual in August., needs to be reminded.

## 2011-11-23 ENCOUNTER — Encounter: Payer: Self-pay | Admitting: Certified Nurse Midwife

## 2011-11-27 ENCOUNTER — Ambulatory Visit: Payer: Self-pay | Admitting: Certified Nurse Midwife

## 2011-12-12 ENCOUNTER — Ambulatory Visit: Payer: Self-pay | Admitting: Obstetrics and Gynecology

## 2011-12-14 ENCOUNTER — Telehealth: Payer: Self-pay

## 2011-12-14 NOTE — Telephone Encounter (Signed)
TC from pt stating that she is having BV and is requesting treatment with gel, doesn't want pills as believes that they don't work any more for her. Pt states she has had symptoms for months, having white d/c, no odor. Informed pt that she may be having a yeast infection. Regina Gates states she used OTC yeast medication a month ago. Pt states that she knows the difference and she has had BV for the past 10 years, since she was 25 years old. LMP 12/07/11, still current, at end of menses, uses condoms for birth control but states not having sex at this time. Informed pt that will request advice from midwife and call her back.

## 2011-12-14 NOTE — Telephone Encounter (Signed)
Return call to pt # and LM to call back to office to schedule appt for evaluation as to type of infection to provide appropriate treatment, especially with long standing hx of BV.

## 2011-12-14 NOTE — Telephone Encounter (Signed)
Yes, she should be seen. Kerby Less, CNM

## 2011-12-15 ENCOUNTER — Telehealth: Payer: Self-pay

## 2011-12-15 NOTE — Telephone Encounter (Signed)
Pt call transferred back to the nurses' line from the front desk. The pt was making an appt for her annual, but is having issues with vaginitis, so she would like treatment called in. Per her last telephone encounter, S. Ludlow suggests that the pt be seen, as she is having atypical sx for BV. The pt is upset that she needs to make another appt, discussed with her that since the sx aren't typical, she should have a vag screen to be certain the right infection is being treated. Transferred back to the appt desk.

## 2011-12-25 ENCOUNTER — Encounter: Payer: Self-pay | Admitting: Obstetrics and Gynecology

## 2011-12-25 ENCOUNTER — Ambulatory Visit: Payer: Self-pay | Admitting: Obstetrics and Gynecology

## 2011-12-25 VITALS — BP 110/70 | Ht 67.0 in | Wt 248.0 lb

## 2011-12-25 DIAGNOSIS — N760 Acute vaginitis: Secondary | ICD-10-CM

## 2011-12-25 MED ORDER — METRONIDAZOLE 0.75 % VA GEL *I*
1.0000 | Freq: Every evening | VAGINAL | Status: AC
Start: 2011-12-25 — End: 2011-12-30

## 2011-12-25 MED ORDER — LEVONORGESTREL 0.75 MG PO TABS *I*
0.7500 mg | ORAL_TABLET | Freq: Two times a day (BID) | ORAL | Status: AC
Start: 2011-12-25 — End: 2011-12-26

## 2011-12-25 NOTE — Patient Instructions (Addendum)
What is bacterial vaginosis?  Bacterial Vaginosis (BV) is the name of a condition in women where the normal balance of bacteria in the vagina is disrupted and replaced by an overgrowth of certain bacteria. It is sometimes accompanied by discharge, odor, pain, itching, or burning.    How common is bacterial vaginosis?  Bacterial Vaginosis (BV) is the most common vaginal infection in women of childbearing age. In the [place], BV is common in pregnant women.    How do people get bacterial vaginosis?  The cause of BV is not fully understood. BV is associated with an imbalance in the bacteria that are normally found in a woman's vagina. The vagina normally contains mostly "good" bacteria, and fewer "harmful" bacteria. BV develops when there is an increase in harmful bacteria.   Not much is known about how women get BV. There are many unanswered questions about the role that harmful bacteria play in causing BV. Any woman can get BV. However, some activities or behaviors can upset the normal balance of bacteria in the vagina and put women at increased risk including:   . Having a new sex partner or multiple sex partners   . Douching  It is not clear what role sexual activity plays in the development of BV. Women do not get BV from toilet seats, bedding, swimming pools, or from touching objects around them. Women who have never had sexual intercourse may also be affected.    What are the signs and symptoms of bacterial vaginosis?  Women with BV may have an abnormal vaginal discharge with an unpleasant odor. Some women report a strong fish-like odor, especially after intercourse. Discharge, if present, is usually white or gray; it can be thin. Women with BV may also have burning during urination or itching around the outside of the vagina, or both. However, most women with BV report no signs or symptoms at all.    What are the complications of bacterial vaginosis?   In most cases, BV causes no complications. But there  are some serious risks from BV including:   . Having BV can increase a woman's susceptibility to HIV infection if she is exposed to the HIV virus.  . Having BV increases the chances that an HIV-infected woman can pass HIV to her sex partner.  . Having BV has been associated with an increase in the development of an infection following surgical procedures such as a hysterectomy or an abortion.  . Having BV while pregnant may put a woman at increased risk for some complications of pregnancy, such as a preterm delivery.  . BV can increase a woman's susceptibility to other STDs, such as herpes simplex virus (HSV), chlamydia and gonorrhea.    How does bacterial vaginosis affect a pregnant woman and her baby?   Pregnant women with BV more often have babies who are born premature or with low birth weight (low birth weight is less than 5.5 pounds).  The bacteria that cause BV can sometimes infect the uterus (womb) and fallopian tubes (tubes that carry eggs from the ovaries to the uterus). This type of infection is called pelvic inflammatory disease (PID). PID can cause infertility or damage the fallopian tubes enough to increase the future risk of ectopic pregnancy and infertility. Ectopic pregnancy is a life-threatening condition in which a fertilized egg grows outside the uterus, usually in a fallopian tube which can rupture.     How is bacterial vaginosis diagnosed?   A health care provider must examine the  vagina for signs of BV and perform laboratory tests on a sample of vaginal fluid to look for bacteria associated with BV.    What is the treatment for bacterial vaginosis?  Although BV will sometimes clear up without treatment, all women with symptoms of BV should be treated to avoid complications. Female partners generally do not need to be treated. However, BV may spread between female sex partners.  Treatment is especially important for pregnant women. All pregnant women who have ever had a premature delivery or low  birth weight baby should be considered for a BV examination, regardless of symptoms, and should be treated if they have BV. All pregnant women who have symptoms of BV should be checked and treated.  Some physicians recommend that all women undergoing a hysterectomy or abortion be treated for BV prior to the procedure, regardless of symptoms, to reduce their risk of developing an infection.  BV is treatable with antibiotics prescribed by a health care provider. Two different antibiotics are recommended as treatment for BV: metronidazole or clindamycin. Either can be used with non-pregnant or pregnant women, but the recommended dosages differ. Women with BV who are HIV-positive should receive the same treatment as those who are HIV-negative.  BV can recur after treatment.    How can bacterial vaginosis be prevented?   BV is not completely understood by scientists, and the best ways to prevent it are unknown. However, it is known that BV is associated with having a new sex partner or having multiple sex partners.  The following basic prevention steps can help reduce the risk of upsetting the natural balance of bacteria in the vagina and developing BV:  . Be abstinent.  . Limit the number of sex partners.  . Do not douche.  . Use all of the medicine prescribed for treatment of BV, even if the signs and symptoms go away.    FOR MORE INFORMATION:   Division of STD Prevention (DSTDP) Centers for Disease Control and Prevention  SolutionApps.co.za     CDC-INFO Contact Center 1-800-CDC-INFO 615-307-5195)   email: cdcinfo@cdc .gov     Website: [employer]Exhibition.com.br     Reviewed 02/2010      IMPLANON SINGLE-ROD CONTRACEPTIVE IMPLANT     What is the single-rod contraceptive implant?    The single-rod contraceptive implant is a type of birth control.  The implant is a small tube made of plastic about the size of a matchstick.  It contains a hormone called etonogestrel (et-oh-no-JES-trel) that prevents users form getting pregnant.  In the  [place], the single-rod implant often goes by it's brand name, Implanon.    How safe and effective is the single-rod implant?  [employer] has said that implants are one of the safest and most effective forms of birth control available.  Even though the single-rod implant is new to the [place], more than 60 million women in the worked have used similar implants.  One of the main reasons the single-rod implant works so well is because users don't have to remember to do something about their birth control every day or every time they have sex.    How does the single-rod implant work?  After the implant is placed in your upper arm, it will slowly release the hormone into your body.  This hormone does several things to keep users from getting pregnant:    It stops eggs from leaving the ovaries.    It makes the mucus around the cervix thick.  This keeps sperm from getting to the egg.    It makes the lining of the uterus thin. This keeps an egg from attaching to the uterus.     What are the benefits of using the single-rod implant?    You do not have to think about your birth control every day or every time you have sex.    Once it is inserted, it works for 3 years.    If you want to stop using it, it can be removed at any time.    You can use it if you are breast feeding.    It does not contain estrogen, a hormone that some women can't use in birth control.    What are the downsides of using the single-rod implant?  Like all medicines, the implant can have some side effects.  For example, women who use the single-rod implant may have:    Irregular bleeding    Heavy and/or longer periods    Periods that are lighter and occur less often    No periods at all  These changes do not mean that something is wrong.  While using the implant, if your period stops, it does not necessarily mean that you are pregnant.  Rarely, other side effects may occur.  Talk about these with your health care provider  so you know what to expect and what to do if they occur.  The single-rod implant does not protect you from HIV or other sexually transmitted infections (STIs).  Use a condom to protect yourself from STIs.      Who should NOT use the single-rod implant?  Only trained health care providers (including doctors, nurses, and nurse midwives) can insert or remove the single-rod implant.  If you health care provider isn't trained to insert an implant, ask for a referral to someone who is trained.    How is the single-rod implant inserted?  The implant is inserted underneath the skin of your arm.  It takes less than 1 minute to insert the implant.  Your health care provider will numb your skin, and then use a thin applicator to insert the implant under your skin.  The small puncture where it is inserted is covered with a bandage and will heal in a few days.  After the implant is in place, you will be able to feel it but not see it.  Make sure you can feel the implant in your arm before you leave your health care provider's office.    Can I get the single-rod implant at any time?  The timing for putting in the implant is very important.  Tell your health care provider when you had your last period.    Before the implant is inserted, you may have to take a pregnancy test.   After it's inserted, you should use a backup method of birth control for 7 days.  This will prevent you from getting pregnant while your body adjusts to the implant.     What if I want to stop using the single-rod implant?  You can use the implant for 3 years before having it removed.  But, if you want to become pregnant or switch to another type of birth control, you can do so at any time.  You can make an appointment with a health care provider to have it removed.  Removal takes about 3 minutes.   Once the implant is taken out, you should be able to get pregnant right  away.  If you don't want to become pregnant after the implant is removed, you will have  to start using another type of birth control for 7 days before it is removed.     Can I keep using the single-rod implant after 3 years?  The single-rod implant must be removed after 3 years because the hormone supplying will run out.  If you want to keep using this method, you can have another implant inserted when the old implant is removed.     Can I use the single-rod implant when I am breast feeding?  Yes, you can use the implant while breast-feeding if 4 weeks have passed since you had your baby.  A small amount of the hormone gets into your breast milk, but it will not harm your baby.    Are there any risks from the procedure I need to know about?  You may feel pain, soreness or have swelling or bruising where the implant was placed.  You also may have a small scar at the insertion site.   Although it is rare, infection is possible.    Very rarely, there are difficulties with the implant. It can be inserted the wrong way or can break, which makes it hard to remove.  The implant may move under skin, which also makes it difficult to remove.  You may be asked to have an x-ray to see the implant.         Reviewed 02/2010

## 2011-12-25 NOTE — Progress Notes (Signed)
Here alone, thinks she has BV.  Says this has been a chronic issue since she was 15.  She says she used to use scented tampons/pads and douche and has never been right "down there" ever since.  She continues to use scented soaps and has to wash at least daily because there is "too much" discharge.  Does not eat yogurt.  Has high carb diet.  Says "I don't eat that much, but what I eat is usually bad."      Also, is not using anything for contraception.  Last unprotected IC 2 days ago.  Would like plan B and to make a plan for a long-term BCM. She doesn't want more children at this time.  Doesn't want ocps or any meds with estrogen at this time as "I'm worried about clots, I smoke and am not active."  Declines depo, doesn't want to weigh more.  Has been off bcms x 1 year as wanted to "get system cleared out."  Would like the nexplanon, aware can have decreased efficacy given BMI.  Gave handout about nexplanon- encouraged to schedule now.  Aware that we need to be assured that she isn't pregnant so no unprotected IC until placement.  Pt agrees.      O:  Spec:  lg amt creamy, malodorous white vaginal d/c.  No erythema, no lesions, bleeding.  Cervix multip, no cmt.     A/P:  26 y/o G1P1 with presumptive BV  Contraceptive counseling- plan B rx'd, pt would like nexplanon. appt asap.  Vaginitis screen/gc/ct sent.  metrogel presumptively sent.  Pt to get probiotics, and use for the next month.  Encouraged to not use any scented soaps, just try rinsing peri area.  Also discussed wt loss, nutrition.  Rto in 1 month for AGY.  Discuss pap then (pt not due but she doesn't like the idea of going several yrs w/o pap).    Kerby Less, CNM  10:02 PM

## 2011-12-26 LAB — CHLAMYDIA PLASMID DNA AMPLIFICATION: Chlamydia Plasmid DNA Amplification: 0

## 2011-12-26 LAB — N. GONORRHOEAE DNA AMPLIFICATION: N. gonorrhoeae DNA Amplification: 0

## 2011-12-26 LAB — VAGINITIS SCREEN: DNA PROBE: Vaginitis Screen:DNA Probe: 0

## 2012-01-01 ENCOUNTER — Telehealth: Payer: Self-pay

## 2012-01-01 NOTE — Telephone Encounter (Signed)
Pt informed of negative results, does not need Metrogel.

## 2012-01-01 NOTE — Telephone Encounter (Signed)
Message copied by Jeronimo Norma on Mon Jan 01, 2012 10:47 AM  ------       Message from: Ronie Spies B       Created: Mon Jan 01, 2012 10:37 AM         Neg results. Please notify patient as I did rx her metrogel, but she didn't need it.         Kerby Less, CNM       10:36 AM         ------

## 2012-01-14 ENCOUNTER — Encounter: Payer: Self-pay | Admitting: Obstetrics and Gynecology

## 2012-01-16 ENCOUNTER — Encounter: Payer: Self-pay | Admitting: Obstetrics and Gynecology

## 2012-01-16 ENCOUNTER — Ambulatory Visit: Payer: Self-pay | Admitting: Obstetrics and Gynecology

## 2012-01-16 VITALS — BP 120/64 | Ht 67.0 in | Wt 248.0 lb

## 2012-01-16 DIAGNOSIS — Z0000 Encounter for general adult medical examination without abnormal findings: Secondary | ICD-10-CM

## 2012-01-16 LAB — POCT URINE PREGNANCY: Lot #: 708616

## 2012-01-16 MED ORDER — VALACYCLOVIR HCL 1000 MG PO TABS *I*
500.0000 mg | ORAL_TABLET | Freq: Two times a day (BID) | ORAL | Status: AC
Start: 2012-01-16 — End: 2012-01-21

## 2012-01-16 NOTE — Patient Instructions (Addendum)
IMPLANON SINGLE-ROD CONTRACEPTIVE IMPLANT     What is the single-rod contraceptive implant?    The single-rod contraceptive implant is a type of birth control.  The implant is a small tube made of plastic about the size of a matchstick.  It contains a hormone called etonogestrel (et-oh-no-JES-trel) that prevents users form getting pregnant.  In the [place], the single-rod implant often goes by it's brand name, Implanon.      How safe and effective is the single-rod implant?  [employer] has said that implants are one of the safest and most effective forms of birth control available.  Even though the single-rod implant is new to the [place], more than 60 million women in the worked have used similar implants.  One of the main reasons the single-rod implant works so well is because users don't have to remember to do something about their birth control every day or every time they have sex.    How does the single-rod implant work?  After the implant is placed in your upper arm, it will slowly release the hormone into your body.  This hormone does several things to keep users from getting pregnant:    It stops eggs from leaving the ovaries.    It makes the mucus around the cervix thick.  This keeps sperm from getting to the egg.    It makes the lining of the uterus thin. This keeps an egg from attaching to the uterus.     What are the benefits of using the single-rod implant?    You do not have to think about your birth control every day or every time you have sex.    Once it is inserted, it works for 3 years.    If you want to stop using it, it can be removed at any time.    You can use it if you are breast feeding.    It does not contain estrogen, a hormone that some women can't use in birth control.    What are the downsides of using the single-rod implant?  Like all medicines, the implant can have some side effects.  For example, women who use the single-rod implant may have:    Irregular bleeding    Heavy and/or longer periods    Periods that are lighter and occur less often    No periods at all  These changes do not mean that something is wrong.  While using the implant, if your period stops, it does not necessarily mean that you are pregnant.  Rarely, other side effects may occur.  Talk about these with your health care provider so you know what to expect and what to do if they occur.  The single-rod implant does not protect you from HIV or other sexually transmitted infections (STIs).  Use a condom to protect yourself from STIs.      Who should NOT use the single-rod implant?  Only trained health care providers (including doctors, nurses, and nurse midwives) can insert or remove the single-rod implant.  If you health care provider isn't trained to insert an implant, ask for a referral to someone who is trained.    How is the single-rod implant inserted?  The implant is inserted underneath the skin of your arm.  It takes less than 1 minute to insert the implant.  Your health care provider will numb your skin, and then use a thin applicator to insert the implant under your skin.  The small puncture where it is inserted is covered with a bandage and will heal in a few days.  After the implant is in place, you will be able to feel it but not see it.  Make sure you can feel the implant in your arm before you leave your health care provider's office.    Can I get the single-rod implant at any time?  The timing for putting in the implant is very important.  Tell your health care provider when you had your last period.    Before the implant is inserted, you may have to take a pregnancy test.   After it's inserted, you should use a backup method of birth control for 7 days.  This will prevent you from getting pregnant while your body adjusts to the implant.     What if I want to stop using the single-rod implant?  You can use the implant for 3 years before having it removed.  But, if you want  to become pregnant or switch to another type of birth control, you can do so at any time.  You can make an appointment with a health care provider to have it removed.  Removal takes about 3 minutes.   Once the implant is taken out, you should be able to get pregnant right away.  If you don't want to become pregnant after the implant is removed, you will have to start using another type of birth control for 7 days before it is removed.     Can I keep using the single-rod implant after 3 years?  The single-rod implant must be removed after 3 years because the hormone supplying will run out.  If you want to keep using this method, you can have another implant inserted when the old implant is removed.     Can I use the single-rod implant when I am breast feeding?  Yes, you can use the implant while breast-feeding if 4 weeks have passed since you had your baby.  A small amount of the hormone gets into your breast milk, but it will not harm your baby.    Are there any risks from the procedure I need to know about?  You may feel pain, soreness or have swelling or bruising where the implant was placed.  You also may have a small scar at the insertion site.   Although it is rare, infection is possible.    Very rarely, there are difficulties with the implant. It can be inserted the wrong way or can break, which makes it hard to remove.  The implant may move under skin, which also makes it difficult to remove.  You may be asked to have an x-ray to see the implant.         Reviewed 02/2010  Kegel Exercises for Women     GENERAL INFORMATION:  What are Kegel exercises? Kegel exercises help to strengthen the pelvic muscles. Kegel exercises may help to bring back or improve bladder control in people with urinary incontinence (in-KON-ti-nens) (urine leakage). These exercises are done by contracting (tightening) and relaxing the pelvic muscles. Kegel exercises are also called pelvic floor muscle training or pelvic floor exercises.  They must be done correctly and regularly in order to help strengthen the pelvic muscles.    What are pelvic muscles? Pelvic muscles are attached to the area between your pelvic (hip) bones. These muscles act like a strong floor that helps hold your pelvic organs in place. Examples of  pelvic organs are the bladder (holds urine), vagina, uterus (womb), and rectum (holds bowel movements). Certain conditions may cause the pelvic muscles to weaken. Some of these conditions include being overweight, aging, or pregnancy and childbirth. When your pelvic muscles become weak, you may have urinary incontinence or other problems.                                                         Which are the correct muscles to use for Kegel exercises? Some people use the wrong muscles when doing Kegel exercises. Instead of using the pelvic muscles, they use their back, abdominal, or upper leg muscles. If you use the wrong muscles, the Kegel exercises will not help you. To make sure you are using the right muscles, try the following:   Sit on the toilet. While passing urine, tighten your muscles to stop the flow of urine. Do this several times until you know what it feels like to tighten the correct muscles. Once you have found the right muscles to use, only do Kegel exercises when you are not urinating.  Lie down and put one finger in your vagina. Tighten your vaginal muscles as if trying to stop urine and a bowel movement from coming out. You should feel the correct muscle tightening around your finger.     How are Kegel exercises done? Kegel exercises can be done anytime and anywhere. You can do them in the morning, noon or night. The exercises can be done while sitting, standing, lying on your back or taking a bath. Always urinate (empty your bladder) before starting. Do these exercises each day or as directed by your caregiver.   Slow contractions:    Contract the muscles around your vagina (birth canal) and anus (rear-end). This  should feel like you are trying to hold back urine or gas.    Hold these muscles for a count of 10.    Slowly release these muscles and relax for a count of 10. Repeat the cycle again.    Do a set of 10 contractions at least three times every day, or as often as your caregiver suggests.  Quick contractions: Do 5 to 10 quick, strong contractions after you are finished doing the slow contractions. These exercises may help you prevent an accident by quickly stopping urine leaks.    Remember: Keep your abdominal (stomach), back and leg muscles relaxed during Kegel exercises. You should feel only the muscles between your legs (pelvic muscles) contracting. Try not to hold your breath while doing these exercises.  What can I do if my muscles are too weak to hold contractions? At the beginning, many people cannot contract their muscles for a count of 10. Start Kegel exercises by squeezing and relaxing pelvic muscles for 4 to 5 seconds each. You can increase your count as your muscle tone improves.    How can I remember to do my Kegel exercises regularly? Do your exercises at the same times every day. For example, do Kegel exercises when you wake up in the morning, right before lunch, and at bedtime. Keep a Kegel exercise chart or diary. Write down or check off how many times each day you do Kegel exercises. Write down how many exercises you do each time.    What else should I know about Kegel exercises?   It may take 3 to 6 months after starting Kegel exercises to see a difference in bladder control. You may begin to notice improved bladder control after 6 to 8 weeks.     Do not stop doing Kegel exercises until you have talked to your caregiver. Kegel exercises may be useful for the rest of your life.    Tighten your pelvic muscles before sneezing, coughing, or lifting to prevent urine  leakage.    What are other methods that can be used to strengthen the pelvic muscles? There are other methods that may be used along with Kegel exercises or separately to strengthen your pelvic muscles. Following are some methods that may be suggested by your caregiver:   Biofeedback: Your caregiver may do biofeedback to help you use the correct muscles. The caregiver may put electrodes on your abdomen and rectal area, or a sensor inside your vagina. Electrodes and sensors are patches or inserts with wires that are attached to a machine. The electrodes or sensors detect your muscle activity and strength and send this information to the machine. This information may help you locate and contract the correct muscles during Kegel exercises.    Vaginal cones: Vaginal cones are small teardrop-shaped weights that are placed inside the vagina. The cones are held in place while doing your normal activities for 15 to 20 minutes, about 3 to 5 times a week. The weights of the cones are increased as the pelvic muscle strength increases.    Electrical stimulation: Electrical stimulation exercises and strengthens your pelvic muscles through contractions. An electrode sends an electrical current or pulse that contracts your pelvic muscles for you.  Call your caregiver if:   You cannot feel your muscles tightening and relaxing. Call if you think you are using the wrong muscles to do Kegel exercises or if your incontinence is getting worse.    Reviewed 10/2011Healthy Practices for Women of all Ages     In addition to your regular OB/GYN history, physical, cholesterol screening, Pap smear, and other recommended cancer screening tests, we ask that you review this list of "healthy practices."  Modifying your daily activities according to these practices may improve your overall health and well-being.  In the event of questions about this list, ask your doctor or health care provider.  Additionally, there are specially written pamphlets  available, which offer further information.    Diet and Exercise:    Limit fat and cholesterol; emphasize fruits, grains and vegetables.    Consume dairy products or use calcium supplementation for adequate calcium intake (1200 mg or more).    Add folic acid supplementation 0.4 mg (400 micrograms, present in most daily multivitamins) at least two months before considering  pregnancy to reduce the risks of birth defects.    Participate in regular exercise for 30 minutes at least five times a week, and consider weight training.    Injury Prevention:    Seat/lap belts should be worn while in a moving car.    Helmet should be used when using motorcycles, bicycles, roller blades and ATV's or skiing.    Place approved smoke detectors in your house and replace the batteries twice a year.      Guns and other firearms should be stored unloaded and in a locked area.  Trigger locks should be used as well.    Consider CPR training for household members.    Dental Health:    Schedule regular visits to the dentist.    Floss and brush with fluoride toothpaste daily.    Immunizations:    A tetanus/diphtheria booster shot (d/T) is recommended every 10 years.    An MMR vaccine is recommended for non-pregnant women born after [birth year] without proof of immunity of documentation of previous immunization.  Adults who are susceptible to varicella (chicken pox) should be vaccinated.    Influenza vaccine is indicated yearly for women age 1 and older, or at any age based on medical history and exposure.  Pregnant women over [redacted] weeks gestation should receive the flu vaccine as well.    Pneumococcal pneumonia vaccine is indicated for age 77 and older once in your life.    Hepatitis A and/or B vaccines are recommended for high-risk individuals.    Substance Abuse:    Stop smoking; do not use any other tobacco products.    Avoid alcohol use when driving, boating, swimming or operating other machinery. Avoid excessive use of alcoholic beverages.     Recreational drug use (marijuana, cocaine, etc.) is dangerous and can be habit-forming.    Sexual Behavior:    Be sure to use contraception if pregnancy is not desired.    Regular use of female or female condoms with spermicide helps prevent STD's.    Consider HIV testing if:  1. You have had more than one sexual partner.  2. You have had any STD's.  3. You have used intravenous drugs.  4. You have a sexual partner with these (the above) risk factors.  5. Your sexual partner has had female homosexual exposure.  6. You received a blood transfusion during 1978-1985.    Breast Health:    Breast self-examinations should be done monthly (after your period).    A mammogram should be done once between ages 17-40, then every 1-2 years based on risk factors.  If there is a strong family history of premenopausal breast cancer, you should start with mammograms earlier than age 52.    Colon Cancer Surveillance:    Beginning at age 69, stool occult blood screening should be done annually and/or sigmoidoscopy (scope examination of the colon) every 3-5 years.    Women and Alcohol  For women, alcohol use carries some unique and special concerns. Most studies on alcohol have been on men, but we now know women may respond differently to alcohol.   Concerns: Liver damage, cancer, menstrual cycle changes, birth defects, fetal alcohol syndrome, intoxication, nutrition and weight management.  All women should avoid all alcohol during pregnancy.  Help for Problem Drinking:    One in every three members of Alcoholics Anonymous (AA) is now a woman.  AA meetings are held regularly and have led the way in demonstrating the effectiveness of self-help.  Outpatient and Inpatient Treatment are also available.  Ask your health care provider for a referral.    The first step in successful treatment is to recognize that there is a problem and accept help.    Dietary and Supplemental Calcium  It is important that you build and protect your bone mass  through a program of regular exercise and proper nutrition with adequate amounts of calcium in your diet. Dairy products are considered to be the most important sources of calcium.    Reviewed 02/2010    Frequently Asked Questions about Quitting Smoking     Are you or someone you know trying to quit smoking? If so, the following information may help you.     Question: Why should I quit?    Answer: You will live longer and feel better. Quitting will lower your chances of having a heart attack, stroke, or cancer. The people you live with, especially children, will be healthier. If you are pregnant, you will improve your chances of having a healthy baby. And you will have extra money to spend on things other than cigarettes.    Question: What is the first thing I need to do once I've decided to quit?    Answer: You should set a quit date-the day when you will break free of your tobacco addiction. Then, consider visiting your doctor or other health care provider before the quit date. She or he can help by providing practical advice and information on the medication that is best for you.    Question: What medication would work best for me?    Answer: Different people do better with different methods. You have five choices of medications that are currently approved by the [place] Food and Drug Administration:     A non-nicotine pill (bupropion SR).     Nicotine gum.     A nicotine inhaler.     A nicotine nasal spray.     Nicotine patch.  The gum and patches are available at your local pharmacy, or you can ask your health care provider to write you a prescription for one of the other medications. The good news is that all five medications have been shown to be effective in helping smokers who are motivated to quit.    Question: How will I feel when I quit smoking? Will I gain weight?    Answer: Many smokers gain weight when they quit, but it is usually less than 10 pounds. Eat a healthy diet, stay active, and try not to let  weight gain distract you from your main goal--quitting smoking. Some of the medications to help you quit may help delay weight gain.    Question: Some of my friends and family are smokers. What should I do when I'm with them?    Answer: Tell them that you are quitting, and ask them to assist you in this effort. Specifically, ask them not to smoke or leave cigarettes around you.  Question: What kinds of activities can I do when I feel the urge to smoke?    Answer: Talk with someone, go for a walk, drink water, or get busy with a task. Reduce your stress by taking a hot bath, exercising, or reading a book.    Question: How can I change my daily routine, which includes smoking a cigarette with my breakfast?    Answer: When you first try to quit, change your routine. Eat breakfast in a different place, and drink tea instead of coffee.  Take a different route to work.    Question: I like to smoke when I have a drink. Do I have to give up both?  Answer: It's best to avoid drinking alcohol for the first 3 months after quitting because drinking lowers your chances of success at quitting. It helps to drink a lot of water and other nonalcoholic drinks when you are trying to quit.    Question: I've tried to quit before and it didn't work. What can I do?    Answer: Remember that most people have to try to quit at least 2 or 3 times before they are successful. Review your past attempts to quit. Think about what worked--and what didn't--and try to use your most successful strategies again.    Question: What should I do if I need more help?    Answer: Get individual, group, or telephone counseling. The more counseling you get, the better your chances are of quitting for good. Programs are given at [employer] and health centers. Call your local health department for information about programs in your area. Also, talk with your doctor or other health care provider.    To get a free print copy of the consumer brochure, You Can  Quit Smoking, call any of the following toll-free numbers:    Agency for Healthcare Research and Quality (AHRQ)  909-296-8418     Centers for Disease Control and Prevention Insurance claims handler)  800-CDC-1311     [employer] ([employer])  800-4-CANCER  More information on quitting is available online at the [facility] [employer] site ([facility])              Reviewed 02/2010

## 2012-01-17 LAB — N. GONORRHOEAE DNA AMPLIFICATION: N. gonorrhoeae DNA Amplification: 0

## 2012-01-17 LAB — CHLAMYDIA PLASMID DNA AMPLIFICATION: Chlamydia Plasmid DNA Amplification: 0

## 2012-01-18 ENCOUNTER — Encounter: Payer: Self-pay | Admitting: Obstetrics and Gynecology

## 2012-01-18 LAB — GYN CYTOLOGY
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
## 2012-01-22 ENCOUNTER — Telehealth: Payer: Self-pay

## 2012-01-22 ENCOUNTER — Ambulatory Visit
Admit: 2012-01-22 | Discharge: 2012-01-22 | Disposition: A | Discharge status: Home / Self Care | Payer: Self-pay | Source: Ambulatory Visit | Admitting: Certified Nurse Midwife

## 2012-01-22 ENCOUNTER — Encounter: Payer: Self-pay | Admitting: Certified Nurse Midwife

## 2012-01-22 ENCOUNTER — Ambulatory Visit: Payer: Self-pay | Admitting: Certified Nurse Midwife

## 2012-01-22 ENCOUNTER — Encounter: Payer: Self-pay | Admitting: Obstetrics and Gynecology

## 2012-01-22 VITALS — BP 120/75 | Ht 67.0 in | Wt 249.0 lb

## 2012-01-22 DIAGNOSIS — Z975 Presence of (intrauterine) contraceptive device: Secondary | ICD-10-CM

## 2012-01-22 DIAGNOSIS — Z01419 Encounter for gynecological examination (general) (routine) without abnormal findings: Secondary | ICD-10-CM

## 2012-01-22 DIAGNOSIS — E669 Obesity, unspecified: Secondary | ICD-10-CM

## 2012-01-22 DIAGNOSIS — Z309 Encounter for contraceptive management, unspecified: Secondary | ICD-10-CM

## 2012-01-22 LAB — POCT URINE PREGNANCY: Lot #: 708616

## 2012-01-22 LAB — TSH: TSH: 1.43 u[IU]/mL (ref 0.27–4.20)

## 2012-01-22 LAB — T4, FREE: Free T4: 1.1 ng/dL (ref 0.9–1.7)

## 2012-01-22 NOTE — Procedures (Signed)
Procedure Report        Implanon Insertion Note    Regina Gates is a 26 y.o. female. Her last pregnancy was 2008. Her current method of birth control is condoms: 100% of the time and prior methods include: Depo-Provera injections and IUD: Mirena. She states her last intercourse was greater than 2 weeks ago. She has not recently used Plan B.    UPT result: negative     Pre-procedure Time out:  Time out participants:pt and myself  2 patient identifiers used:yes: name and dob  Correct site/procedure confirmed: yes: left arm  Consent signed: yes:  Time procedure started: 0950    Consent:   The procedure risks, benefits, complications and possible alternatives were discussed with the patient. All questions were answered prior to the patient signing the informed consent.    Procedure Details  The patient's upper arm was prepped with povidone iodine  and the insertion site 8-10 cm proximal to the medial epicondyl was injected with approximately 3 cc of 1% lidocaine. Lidocaine was injected a second time because pt was nervous and did not feel she was adequately numb. The Implanon device was inserted subdermally in the crease between the biceps and triceps muscles in the usual manner without difficulty.  A sterile dressing was applied.    Lot #: [id number]  Expiration date: 02/2014    Removal date: 01/22/2015    Post procedure pain rated as 0     Assessment:   Contraceptive management with Implanon Contraceptive insertion.    Plan:  Post insertion instructions were reviewed with the patient and written information was also provided.    The plan was reviewed with the patient including:  - Her arm will be achy for about 4 days.  - Her arm will have mild swelling and a bruise as it heals.  - Keep the dressing on for a day.  - Keep the area clean and dry.  - No heavy lifting with that arm for a day.  - Take acetaminophen (Tylenol) or ibuprofen (Motrin, Advil) for the soreness.    She was advised to call if she has any of  the following:  - fever  - excessive swelling at the site  - severe pain in her arm  - bright redness at the insertion site    rto for AE and as needed.  Dianna Rossetti, CNM

## 2012-01-22 NOTE — Telephone Encounter (Signed)
TC from the pt, she is wondering if her arm is supposed to be sore. Eveleigh had an Implanon placed this morning. Reviewed post insertion instructions and precautions with the pt, she states that she did not look at her papers.

## 2012-02-13 ENCOUNTER — Other Ambulatory Visit: Payer: Self-pay | Admitting: Obstetrics and Gynecology

## 2012-03-15 ENCOUNTER — Telehealth: Payer: Self-pay | Admitting: Obstetrics and Gynecology

## 2012-03-15 NOTE — Telephone Encounter (Signed)
Returned telephone call from [employer]. Pt stated in message she is having "diabetic symptoms." Left message for her to call back.    Will refer pt to PCP for diabetic testing if necessary. May need to refer to [facility].    Gertie Exon, RN

## 2012-03-15 NOTE — Telephone Encounter (Signed)
Returned telephone call from [employer]. Pt stated that she is having numbness & tingling in her extremities and increased thirst. Recommended she contact her PCP, and suggested contacting [facility] if she needs a new PCP. Pt verbalized understanding.    Gertie Exon, RN

## 2012-03-15 NOTE — Telephone Encounter (Signed)
The pt had called, and left a message on the nurses' voice mail. Return call to her at this time.

## 2012-05-15 ENCOUNTER — Ambulatory Visit: Payer: Self-pay | Admitting: Obstetrics and Gynecology

## 2012-05-15 ENCOUNTER — Encounter: Payer: Self-pay | Admitting: Obstetrics and Gynecology

## 2012-05-15 ENCOUNTER — Other Ambulatory Visit: Payer: Self-pay | Admitting: Obstetrics and Gynecology

## 2012-05-15 VITALS — BP 120/80 | Ht 67.0 in | Wt 253.0 lb

## 2012-05-15 DIAGNOSIS — Z708 Other sex counseling: Secondary | ICD-10-CM

## 2012-05-15 MED ORDER — NORGESTIMATE-ETH ESTRADIOL 0.25-35 MG-MCG PO TABS *I*
1.0000 | ORAL_TABLET | Freq: Every day | ORAL | Status: DC
Start: 2012-05-15 — End: 2012-05-24

## 2012-05-15 NOTE — Progress Notes (Signed)
Subjective:      Regina Gates is a 27 y.o. female who presents for sexually transmitted disease check. Sexual history reviewed with the patient. She and her current partner had a brief "break-up" and she wants to be sure that everything is "OK".   Contraception: Nexplanon  Menstrual History:  OB History    Grav Para Term Preterm Abortions TAB SAB Ect Mult Living    1 1 1  0 0 0 0 0 0 1           Patient's last menstrual period was 04/14/2012.       Patient's medications, allergies, social  histories were reviewed and updated as appropriate.    Review of Systems  Pertinent items are noted in HPI.      Objective:      BP 120/80  Ht 5\' 7"  (1.702 m)  Wt 253 lb (114.76 kg)  BMI 39.62 kg/m2  LMP 04/14/2012  General:   alert and no distress   Lymph Nodes:     Pelvis:  Vagina: mod discharge, no purulent discharge    Cultures:  GC and Chlamydia genprobes and vaginitis screen (trich, gardnerella, yeast)       Assessment:      Possible STD exposure      Plan:      Discussed safe sexual practice in detail  See orders for STD cultures and assays  Will call pt for any + results  Will call pt with positive results

## 2012-05-15 NOTE — Addendum Note (Signed)
Addended by: Nedra Hai on: 05/15/2012 02:17 PM     Modules accepted: Orders

## 2012-05-16 ENCOUNTER — Telehealth: Payer: Self-pay

## 2012-05-16 LAB — N. GONORRHOEAE DNA AMPLIFICATION: N. gonorrhoeae DNA Amplification: 0

## 2012-05-16 LAB — CHLAMYDIA PLASMID DNA AMPLIFICATION: Chlamydia Plasmid DNA Amplification: 0

## 2012-05-16 LAB — VAGINITIS SCREEN: DNA PROBE: Vaginitis Screen:DNA Probe: 0

## 2012-05-16 NOTE — Telephone Encounter (Signed)
Pt informed vaginitis screen negative, CT and GC in process. Pt will call again re: results

## 2012-05-16 NOTE — Telephone Encounter (Signed)
Pt informed results of GC & CT negative.

## 2012-05-24 ENCOUNTER — Ambulatory Visit: Payer: Self-pay | Admitting: Certified Nurse Midwife

## 2012-05-24 ENCOUNTER — Telehealth: Payer: Self-pay

## 2012-05-24 ENCOUNTER — Encounter: Payer: Self-pay | Admitting: Certified Nurse Midwife

## 2012-05-24 ENCOUNTER — Ambulatory Visit
Admit: 2012-05-24 | Discharge: 2012-05-24 | Disposition: A | Discharge status: Home / Self Care | Payer: Self-pay | Source: Ambulatory Visit | Admitting: Certified Nurse Midwife

## 2012-05-24 VITALS — BP 120/80 | Ht 67.0 in | Wt 250.0 lb

## 2012-05-24 DIAGNOSIS — Z3046 Encounter for surveillance of implantable subdermal contraceptive: Secondary | ICD-10-CM

## 2012-05-24 DIAGNOSIS — Z0000 Encounter for general adult medical examination without abnormal findings: Secondary | ICD-10-CM

## 2012-05-24 MED ORDER — ULIPRISTAL ACETATE 30 MG PO TABS *I*
30.0000 mg | ORAL_TABLET | Freq: Once | ORAL | Status: AC
Start: 2012-05-24 — End: 2012-05-24

## 2012-05-24 MED ORDER — LEVONORGESTREL 0.75 MG PO TABS *I*
0.7500 mg | ORAL_TABLET | Freq: Two times a day (BID) | ORAL | Status: DC
Start: 2012-05-24 — End: 2012-05-24

## 2012-05-24 NOTE — Telephone Encounter (Signed)
Pharmacy calling, Samson Frederic is not in stock, would not be able to get it until 05/27/12, they were unable to reach pt.  Will route to B Skovgaard,CNM

## 2012-05-24 NOTE — Telephone Encounter (Signed)
Okay for pt to pick up [facility] 05/27/12 as is for back up per B Skovgaard,CNM.

## 2012-05-24 NOTE — Procedures (Signed)
Procedure Report        Author: Dianna Rossetti, CNM  as of: 05/24/2012  at: 1:35 PM  Nexplanon Removal Note    Regina Gates is a 27 y.o. female, G1P1001 with a last period of 04/14/2012.    Her current method of birth control is Nexplanon.     Patient requests Implanon removal due to feeling she has mood changes and depression.        Consent:  The procedure risks, benefits, complications and possible alternatives were discussed with the patient. Patient understands that she may become pregnant after removal. All questions were answered prior to the patient signing the informed consent.    Pre-Procedural Time Out:  05/24/2012                                         1:36 PM    Correct Procedure: Yes  Correct Patient: (use 2 Identifiers) Yes  Correct Site: Yes  Site marked: No  Correct Patient Position: Yes  Consent Verified: Yes  Appropriate Hand Hygiene Used: Yes  List Any Participants Involved in Time-Out : patient, Dianna Rossetti, CNM      Procedure Details       Site identified and confirmed with patient. Site marked with skin marker and prepped with povidone iodine in the usual sterile fashion.  Local anesthesia achieved with injection of 2 mL of 1% lidocaine with epinephrine into distal implant area.  Performed linear incision parallel to the device.  Probed wound for distal end of Implanon device. Device removed intact.   Dry sterile pressure dressing placed.  Patient tolerated the procedure well. Blood loss was minimal.        Post procedure pain rated as 0       Assessment:   Implanon removal at patient request.     Plan:  Post removal instructions were reviewed with the patient and written information was also provided.    The plan was reviewed with the patient including:  - Monitoring for heavy bleeding, fever or severe pain.  - Other instructions: reviewed condom use and EC. rx for Samson Frederic  - The patient will follow up as needed  - Post Implanon removal patient will use condoms: 100% of the time for  contraception. rx for [facility]      All questions were answered and the patient stated a good understanding of instructions.    Dianna Rossetti, CNM

## 2012-05-24 NOTE — Progress Notes (Signed)
Here with son. Mood issues since insertion. Feels this is due to Nexplanon. Also has had irreg bleeding.   Requests removal.   Dianna Rossetti, CNM

## 2012-05-31 ENCOUNTER — Ambulatory Visit: Payer: Self-pay | Admitting: Obstetrics and Gynecology

## 2012-07-15 ENCOUNTER — Other Ambulatory Visit: Payer: Self-pay | Admitting: Certified Nurse Midwife

## 2012-07-15 ENCOUNTER — Other Ambulatory Visit: Payer: Self-pay | Admitting: Obstetrics and Gynecology

## 2012-07-15 MED ORDER — LEVONORGESTREL 1.5 MG PO TABS *I*
1.5000 mg | ORAL_TABLET | Freq: Once | ORAL | Status: AC
Start: 2012-07-15 — End: 2012-07-15

## 2012-07-15 NOTE — Telephone Encounter (Signed)
Pt has been using condoms for birth control, "slipped up" last night. Discussed with CNM, will RX EC today, pt advised to come in to discuss birth control options, pt agrees and appt scheduled.

## 2012-07-15 NOTE — Telephone Encounter (Signed)
Pt calling to req refill of ELLA, planned birth control method is condoms and EC, had Nexplanon removed 05/24/12.  Will route to CNM for Rx

## 2012-07-23 ENCOUNTER — Encounter: Payer: Self-pay | Admitting: Obstetrics and Gynecology

## 2012-07-25 ENCOUNTER — Ambulatory Visit: Payer: Self-pay | Admitting: Obstetrics and Gynecology

## 2012-08-05 ENCOUNTER — Encounter: Payer: Self-pay | Admitting: Obstetrics and Gynecology

## 2012-08-05 ENCOUNTER — Ambulatory Visit: Payer: Self-pay | Admitting: Obstetrics and Gynecology

## 2012-08-05 VITALS — BP 120/60 | Ht 67.0 in | Wt 257.0 lb

## 2012-08-05 MED ORDER — LEVONORGESTREL 1.5 MG PO TABS *I*
1.5000 mg | ORAL_TABLET | Freq: Once | ORAL | Status: AC
Start: 2012-08-05 — End: 2012-08-05

## 2012-08-05 MED ORDER — LEVONORGESTREL-ETHINYL ESTRAD 0.1-20 MG-MCG PO TABS *I*
1.0000 | ORAL_TABLET | Freq: Every day | ORAL | Status: DC
Start: 2012-08-05 — End: 2012-12-03

## 2012-08-05 NOTE — Patient Instructions (Addendum)
Essure:  Permanent Birth Control                        Trusted by hundreds of thousands of women and their doctors for over five years, Essure is a permanent birth control procedure that works with your body to create a natural barrier against pregnancy. This gentle procedure can be performed in a doctor's office in less than 10 minutes.    Essure is covered by [employer], and if the Essure procedure is performed in a doctor's office, depending on your specific insurance plan, payment may be as low as a simple co-pay.    Essure offers women what no birth control ever has     No surgery, burning or anesthesia     No hormones     No slowing down to recover     Performed in less than 10 minutes     Peace of mind - your doctor can confirm when you can rely on Essure for birth control     Trusted by hundreds of thousands of women and doctors for over five years   With Essure, you'll never have to worry about unplanned pregnancy again. Essure is 99.74% effective with zero pregnancies*, making it the most effective form of permanent birth control available.    The Essure procedure is permanent and is NOT reversible. Therefore, you should be sure you do not want children in the future.    About the Procedure  Unlike other permanent birth control, the Essure procedure does not require cutting into the body or the use of radiofrequency energy to burn the fallopian tubes. Instead, an Essure trained doctor inserts soft, flexible inserts through the body's natural pathways (vagina, cervix, and uterus) and into your fallopian tubes. The very tip of the device remains outside the fallopian tube, which provides you and your doctor with immediate visual confirmation of placement.    During the 3 months following the procedure, your body and the inserts work together to form a natural barrier that prevents sperm from reaching the egg. During this period, you must continue using another form of birth control (other  than an IUD).  After three months, it's time to get an Essure Confirmation Test to verify you're protected from the worries of unplanned pregnancy. The test uses a dye and special type of x-ray to ensure both that the inserts are in place and that the fallopian tubes are completely blocked.     Unlike birth control pills, patches, rings, and some forms of IUDs, Essure does not contain hormones to interfere with your natural menstrual cycle. Your periods should more or less continue in their natural state.  Reviewed 02/2010    The Birth Control Menu   How often Average 1st year  Failure Rate Lowest 1st Year Failure Rate   Spermicide   Rhythm method    Pull out    Female condoms    Every sex   Every sex   Every sex   Every sex    28%   25%   24%   18%    18%   5%   4%   2%      Pills    Patch   Vaginal Ring   Injection (DepoProvera)    Every day   Every week   Every month     Every 3 months    9%   9%   9%  6%    0.3%   0.3%   0.3%     0.2%      Copper IUD (ParaGard)   Hormone IUD (Mirena)  Arm Implant (Implanon)      10 years       5 years      3 years      0.8%     0.2%        0.05%       Tubal ligation    Vasectomy     Forever   Forever    0.5%   0.2%       Reviewed 02/2010    Birth Control Pills           What are birth control pills? Birth control pills (BCPs), also called oral contraceptives or the pill, help you avoid getting pregnant. They help you and your partner plan how many children you want and when to have them. They are made of the female hormones, estrogen and progesterone, which control body functions. These hormones work by preventing ovulation. Ovulation is the time each month when the ovaries make and release an egg cell. Before pregnancy can occur, an egg needs to be fertilized by sperm. Birth control pills keep women from making an egg cell each month. They may also keep a fertilized egg from sticking to the lining of the uterus (womb) and growing into a baby. When BCPs are used correctly, the  chances of women getting pregnant are very low.    What is the female reproductive system?   A woman's reproductive system includes the ovaries, fallopian tubes, uterus, cervix, and vagina. The ovaries are two egg-shaped organs that make egg cells, and are found on the ends of fallopian tubes. The fallopian tubes are two hollow tubes on each side of the upper part of the uterus. These are passages for the egg cells from the ovaries to the uterus. The uterus, or womb, is the pear-shaped organ in your abdomen (stomach) where your baby grows during pregnancy. The cervix is the opening where sperm enters at the bottom of the uterus. The vagina is a passageway that receives the female's penis.   The ovaries make and release an egg cell each month during ovulation. The egg cell goes from the ovary into a fallopian tube. About 10 to 14 days later, the woman will have her menstruation (monthly period). If sperm enters the uterus and reaches the fallopian tubes, the egg may be fertilized (sperm meets the egg cell). The fertilized egg then goes down the tube and sticks to the endometrium (lining of uterus). The baby grows inside the uterus during pregnancy.    What may be done before I start using birth control pills?   Your caregiver will ask you about diseases and illnesses you have had in the past. He will check your risk for blood clots, heart conditions, or stroke (problem with blood vessels of your brain). He will also check your blood pressure, and may do a breast and pelvic exam. A Pap smear may also be done during the pelvic exam. This is a test to make sure you do not have abnormal changes on your cervix. You may need other tests, like a urine test, to make sure that you are not pregnant. You will need to see your caregiver regularly while using BCPs.    Your caregiver will ask about medicines that you use. Your caregiver may also ask you if you smoke. Smoking increases your  chances of a having stroke, heart attack,  or a blood clot in your lungs. If you smoke, you should not take certain kinds of BCPs.        What are the types of birth control pills? The different types of BCPs include monophasic, multiphasic, progesterone-only, low-dose, and extended cycle BCPs. They have different amounts of estrogen and progesterone, side effects, risks, and schedules for taking them.     Monophasic birth control pills: These pills have the same amount of estrogen and progesterone in each active pill. They may help prevent sudden changes in your mood or feelings caused by changing hormone levels.     Multiphasic birth control pills: The level of hormones in these pills change like those in your body through the month. Each pill has different amounts of estrogen and progesterone depending on the day they will be taken. This helps you get the right    amount of hormones and helps prevent any unwanted side effects.    Progesterone-only birth control pills: These BCPs only contain progesterone, which prevents ovulation and thickens cervical mucus to block sperm. Progesterone also prevents a fertilized egg from attaching to the endometrium and letting a baby grow in the uterus. They may give you less nausea, breast pain, weight gain, and changes in mood than other BCPs.  Low-dose birth control pills: These BCPs have lower amounts of the estrogen and progesterone which may help avoid unwanted side effects. They are available in monophasic and multiphasic forms, and may help prevent blood clots and putting on weight.          Extended cycle birth control pills: These pills are taken every day for    three months at a time to stop ovulation. You may also have less frequent   periods, or you may miss periods completely. You may have some bleeding   during the first 3 to 4 months of use. Do not worry, this should go away after   some time. These pills may also be used to treat conditions of your uterus,   such as endometriosis.    When do I take my  birth control pills?   If your pills are packaged in a 21-day pack, take one pill from the pack every day. After you finish the 21-day pack, do not take any BCPs for the next seven days. You will have your period during the seven days off the pill. Start a new pack on day eight.    If your pills are packaged in a 28-pack, take one pill from the pack every day. The last seven pills in the 28-day pack are usually a different color than the rest of the pills. You may start a new pack after finishing the old one.    If you are taking extended cycle birth control pills, take one pill each day for 12 weeks and take care not to miss a pill.    Pick a time of the day that is easy for you to take BCPs. Taking them at the same time every day may help prevent bleeding. If you want to change the time you take BCPs, finish a pack of pills and try a different time for the next pack.  What are the advantages of using birth control pills? Birth control pills may help decrease bleeding and pain during your monthly period. They may also help prevent cancer of the uterus and ovaries.    What are the disadvantages of using birth control  pills?   You may have sudden changes in your mood or feelings when taking BCPs. You may have nausea (upset stomach), decreased appetite for sex, and increased risk for blood clots. You may have an increased appetite and gain weight very fast. You may also have bleeding in between periods, less frequent periods, vaginal dryness, and breast pain.    When can I start taking my birth control pills? You may start taking your pills at any of the   following times:   The first day of your monthly period: You will be protected right away. You may not need to use another birth control method during the first seven days of your period.    The first sunday after your monthly period begins: Start taking your BCPs the first sunday after your monthly period begins. You may take them even if you still have your  period.    The fifth day of your monthly period: Use another birth control method for the first 7 days after taking the pill. Ask your caregiver about other birth control methods.    What should I do if I forget to take my pills?     If you miss one pill, take it as soon as you remember. Continue taking the remaining pills at your usual time.  If you miss two pills in a row, take one as soon as you remember. Continue taking the remaining pills at your usual time.  If you miss three BCPs in a row, call your caregiver. You may have to stop that pack of pills, wait for your period, and start a new pack  Do not take two BCPs in one day.  Use barrier methods of contraception for two weeks if you cannot remember how many pills you may have missed. You may get pregnant if you have sex and not taken two or more BCPs in a row. You may want to consider using another method of birth control if you find that you forget to take your BCPs often.    What should do I do if I want to get pregnant? If you are planning to have a baby, ask your caregiver when you may stop taking your BCPs. It may take some time for you to start ovulating again. Ask your caregiver for more information about getting pregnant after taking BCPs.    When should I start taking birth control pills after having a baby? Your caregiver may let you start taking progesterone-only pills after giving birth. For those breast feeding, some BCPs may only be started from six weeks to six months after giving birth. For those not breast feeding, BCPs may be started three weeks after giving birth. Ask your caregiver for more information about taking BCPs after giving birth.      Reviewed 02/2010

## 2012-08-05 NOTE — Progress Notes (Signed)
Subjective:      Breeanne Catalanotto is accompanied by her son to today's appt; she was last seen 05/14/12 at [facility]. She is a 27 y.o. G37P1001 female who presents for contraception counseling.   The patient is sexually active. Current partner is a casual partner, she reports she is not in a committed relationship.  Currently using condoms: 100% of the time. Last IC 07/14/12; she reports condom broke. She is interested in BTL contraception.  BCM h/o Mirena, Nexplanon, COC, Nuvaring.   Pertinent past medical history: obesity.  LMP 3/17  Review of Systems Pertinent items are noted in HPI.     Objective:      General appearance: alert, cooperative, no distress and morbidly obese   poct upt neg  Assessment:      27 y.o., starting OCP (estrogen/progesterone), no contraindications.     Plan:   Urine GC/CT cx  Lengthy discussion of BTL, lap vs ESSURE methods,and written pt info handouts re both. Amb Referral to [facility] re BTL on 5/5. Pt advised to expect BTL would NOT be a reversible BCM and that as she is 27 yo she has another 15 yrs of fertility ahead of her.  Pt given condoms for contraception and prn safer sex.  Written pt info handouts re [facility] options.  She would like to start  rx COCs while awaiting BTL and she was informed of need for interim BCM if she & [facility] physician select ESSURE method. rx Aviane; she'll start with next menses, rev'd use, possible SE & ACHES. Written pt info handouts rev'd.  RV for annual gyn exam 01/2013 and she'll call prn.  Donald Pore, CNM

## 2012-08-06 LAB — CHLAMYDIA PLASMID DNA AMPLIFICATION: Chlamydia Plasmid DNA Amplification: 0

## 2012-08-06 LAB — N. GONORRHOEAE DNA AMPLIFICATION: N. gonorrhoeae DNA Amplification: 0

## 2012-08-09 ENCOUNTER — Telehealth: Payer: Self-pay

## 2012-08-09 NOTE — Telephone Encounter (Signed)
TC from the pt. Regina Gates is c/o feeling crampy, mid-lower abdomen, she is in the middle of her cycle. No bleeding. No c/o constipation or diarrhea.     BCM: condoms, she will begin OCPs after she has her menses in ~2 weeks.    Advised her to take Ibuprofen, try a hot pack. Discussed that the crampiness may be ovulatory pain. She will call back next week if the sx continue.

## 2012-09-09 ENCOUNTER — Ambulatory Visit: Payer: Self-pay | Admitting: Obstetrics & Gynecology

## 2012-12-03 ENCOUNTER — Ambulatory Visit: Payer: Self-pay | Admitting: Obstetrics and Gynecology

## 2012-12-03 ENCOUNTER — Encounter: Payer: Self-pay | Admitting: Obstetrics and Gynecology

## 2012-12-03 VITALS — BP 120/70 | Ht 67.0 in | Wt 252.0 lb

## 2012-12-03 DIAGNOSIS — Z0000 Encounter for general adult medical examination without abnormal findings: Secondary | ICD-10-CM

## 2012-12-03 DIAGNOSIS — Z01419 Encounter for gynecological examination (general) (routine) without abnormal findings: Secondary | ICD-10-CM

## 2012-12-03 NOTE — Progress Notes (Signed)
Subjective:      Regina Gates is a 27 y.o. single AA  female who presents for an annual exam. The patient has no complaints today.  Desires STD, in a new relationship.    GYN History  LMP: 11/19/2012  Menses:flow is moderate  Cramps: None  Menarche: 13y0  Last pap smear: Date: 2013 Results: no abnormalities  History of abnormal pap smear: No. Previous abnormality:no abnormalities,  The patient is sexually active.   Sexually active: single partner, contraception - condoms: 100% of the time  Together with partner: a few months  STD History: none  Current contraception: condoms: 100% of the time  Last Coitus: uncertain      Obstetric History    G1   P1   T1   P0   A0   TAB0   SAB0   E0   M0   L1        Screening History:  Gyn screening history: last pap: was normal  The patient has never been taking hormone replacement therapy.  The patient wears seatbelts:yes  The patient participates in regular exercise: no  Has the patient ever been transfused or tattooed?:yes  Lives with family  Domestic violence:No    Patient's medications, allergies, past medical, surgical, social and family histories were reviewed and updated as appropriate.    Review of Systems  A comprehensive review of systems was negative.      Objective:      BP 120/70  Ht 1.702 m (5\' 7" )  Wt 114.306 kg (252 lb)  BMI 39.46 kg/m2  LMP 11/19/2012  General appearance: alert and no distress  Head: Normocephalic, without obvious abnormality, atraumatic  Neck: no adenopathy, supple, symmetrical, trachea midline and thyroid not enlarged, symmetric, no tenderness/mass/nodules  Back: negative, range of motion normal  Lungs: clear to auscultation bilaterally  Breasts: normal appearance, no masses or tenderness  Heart: regular rate and rhythm and S1, S2 normal  Abdomen: soft, non-tender; bowel sounds normal; no masses,  no organomegaly  Pelvic: cervix normal in appearance, external genitalia normal, no adnexal masses or tenderness, no cervical motion  tenderness, rectovaginal septum normal, uterus normal size, shape, and consistency and vagina normal without discharge  Extremities: extremities normal, atraumatic, no cyanosis or edema  Skin: Skin color, texture, turgor normal. No rashes or lesions Tatoos noted   Microscopic wet mount shows N/A .      Assessment:      Regina Gates is a 27 y.o. female who presents for an annual exam. She has the following concerns: 1) STD check 2) pap smear- pt desires yearly    Normal gyn exam     Plan:       Screening:STD: Labs:GC,CT, Pap,    Preventative health discussed Calcium intake  RTC 1year or prn

## 2012-12-03 NOTE — Patient Instructions (Signed)
Healthy Practices for Women of all Ages     In addition to your regular OB/GYN history, physical, cholesterol screening, Pap smear, and other recommended cancer screening tests, we ask that you review this list of “healthy practices.”  Modifying your daily activities according to these practices may improve your overall health and well-being.  In the event of questions about this list, ask your doctor or health care provider.  Additionally, there are specially written pamphlets available, which offer further information.    Diet and Exercise:  - Limit fat and cholesterol; emphasize fruits, grains and vegetables.  - Consume dairy products or use calcium supplementation for adequate calcium intake (1200 mg or more).  - Add folic acid supplementation 0.4 mg (400 micrograms, present in most daily multivitamins) at least two months before considering pregnancy to reduce the risks of birth defects.  - Participate in regular exercise for 30 minutes at least five times a week, and consider weight training.    Injury Prevention:  - Seat/lap belts should be worn while in a moving car.  - Helmet should be used when using motorcycles, bicycles, roller blades and ATV’s or skiing.  - Place approved smoke detectors in your house and replace the batteries twice a year.    - Guns and other firearms should be stored unloaded and in a locked area.  Trigger locks should be used as well.  - Consider CPR training for household members.    Dental Health:  - Schedule regular visits to the dentist.  - Floss and brush with fluoride toothpaste daily.    Immunizations:  - A tetanus/diphtheria booster shot (d/T) is recommended every 10 years.  - An MMR vaccine is recommended for non-pregnant women born after 1955 without proof of immunity of documentation of previous immunization.  Adults who are susceptible to varicella (chicken pox) should be vaccinated.  - Influenza vaccine is indicated yearly for women age 65 and older, or at any age based  on medical history and exposure.  Pregnant women over [redacted] weeks gestation should receive the flu vaccine as well.  - Pneumococcal pneumonia vaccine is indicated for age 65 and older once in your life.  - Hepatitis A and/or B vaccines are recommended for high-risk individuals.    Substance Abuse:  - Stop smoking; do not use any other tobacco products.  - Avoid alcohol use when driving, boating, swimming or operating other machinery. Avoid excessive use of alcoholic beverages.  - Recreational drug use (marijuana, cocaine, etc.) is dangerous and can be habit-forming.    Sexual Behavior:  - Be sure to use contraception if pregnancy is not desired.  - Regular use of female or female condoms with spermicide helps prevent STD’s.  - Consider HIV testing if:  1. You have had more than one sexual partner.  2. You have had any STD’s.  3. You have used intravenous drugs.  4. You have a sexual partner with these (the above) risk factors.  5. Your sexual partner has had female homosexual exposure.  6. You received a blood transfusion during 1978-1985.    Breast Health:  - Breast self-examinations should be done monthly (after your period).  - A mammogram should be done once between ages 35-40, then every 1-2 years based on risk factors.  If there is a strong family history of premenopausal breast cancer, you should start with mammograms earlier than age 35.    Colon Cancer Surveillance:  - Beginning at age 50, stool occult blood screening should   be done annually and/or sigmoidoscopy (scope examination of the colon) every 3-5 years.    Women and Alcohol  For women, alcohol use carries some unique and special concerns. Most studies on alcohol have been on men, but we now know women may respond differently to alcohol.   Concerns: Liver damage, cancer, menstrual cycle changes, birth defects, fetal alcohol syndrome, intoxication, nutrition and weight management.  All women should avoid all alcohol during pregnancy.  Help for Problem  Drinking:  • One in every three members of Alcoholics Anonymous (AA) is now a woman.  AA meetings are held regularly and have led the way in demonstrating the effectiveness of self-help.  • Outpatient and Inpatient Treatment are also available.  Ask your health care provider for a referral.  • The first step in successful treatment is to recognize that there is a problem and accept help.    Dietary and Supplemental Calcium  It is important that you build and protect your bone mass through a program of regular exercise and proper nutrition with adequate amounts of calcium in your diet. Dairy products are considered to be the most important sources of calcium.    Reviewed 02/2010

## 2012-12-04 LAB — CHLAMYDIA PLASMID DNA AMPLIFICATION: Chlamydia Plasmid DNA Amplification: 0

## 2012-12-04 LAB — N. GONORRHOEAE DNA AMPLIFICATION: N. gonorrhoeae DNA Amplification: 0

## 2012-12-05 LAB — GYN CYTOLOGY

## 2012-12-10 ENCOUNTER — Ambulatory Visit: Payer: Self-pay | Admitting: Obstetrics and Gynecology

## 2013-01-11 ENCOUNTER — Telehealth: Payer: Self-pay | Admitting: Obstetrics and Gynecology

## 2013-01-11 NOTE — Telephone Encounter (Signed)
Page via answering service "not pregnant, possible yeast infection".  Attempted to call pt, no answer, no voicemail.    Phoebe Sharps, CNM

## 2013-01-11 NOTE — Telephone Encounter (Signed)
Pt called back to ask for yeast treatment, she was given antibiotics 4x/d for 10 days after being stung by hornet. She now has intense vaginal itching.  She has had yeast inf in the past. Diflucan has worked and that is what she requests. She is OOT visiting in [place].  She give pharmacy phone number of [facility] [phone].    Rx Diflucan called to pt pharmacy. Pt very appreciative.    Phoebe Sharps, CNM

## 2013-01-19 ENCOUNTER — Encounter: Payer: Self-pay | Admitting: Obstetrics and Gynecology

## 2013-04-14 ENCOUNTER — Ambulatory Visit
Admit: 2013-04-14 | Discharge: 2013-04-14 | Disposition: A | Discharge status: Home / Self Care | Payer: Self-pay | Source: Ambulatory Visit | Admitting: Obstetrics and Gynecology

## 2013-04-14 ENCOUNTER — Ambulatory Visit: Payer: Self-pay | Admitting: Obstetrics and Gynecology

## 2013-04-14 ENCOUNTER — Encounter: Payer: Self-pay | Admitting: Obstetrics and Gynecology

## 2013-04-14 VITALS — BP 120/75 | Ht 67.0 in | Wt 264.0 lb

## 2013-04-14 DIAGNOSIS — N921 Excessive and frequent menstruation with irregular cycle: Secondary | ICD-10-CM

## 2013-04-14 DIAGNOSIS — Z6835 Body mass index (BMI) 35.0-35.9, adult: Secondary | ICD-10-CM | POA: Insufficient documentation

## 2013-04-14 DIAGNOSIS — IMO0002 Reserved for concepts with insufficient information to code with codable children: Secondary | ICD-10-CM

## 2013-04-14 DIAGNOSIS — E669 Obesity, unspecified: Secondary | ICD-10-CM | POA: Insufficient documentation

## 2013-04-14 LAB — POCT URINE PREGNANCY: Lot #: 107320

## 2013-04-14 LAB — POCT VAGINAL WET MOUNT
CLUE CELLS,POC: NEGATIVE
KOH FOR FUNGUS: NEGATIVE
TRICHOMONAS, POC: NEGATIVE
WHIFF TEST: NEGATIVE
YEAST,POC: NEGATIVE

## 2013-04-14 NOTE — Progress Notes (Signed)
S: Pt here today for STD testing and spotting.   Spotting started 5-6 days ago, was bright red and now brownish, notices when she wipes after urination.   Using condoms and withdrawal for [facility] but no consistently, does not desire another pregnancy, keeps Plan B around for [facility]. Last unprotected sex was 3 weeks ago with no EC, requesting pregnancy test today.   Reports "using every kind of birth control but nothing works", dislikes IUD, Implanon, pills etc. No interest in finding another method today.   Same partner x 1yr, possible STD exposure.    Not sure if she got a pap smear last visit 11/2012 because she saw the cervical brush in the trash, asking for clarification today.     O: Last pap 12/03/12 Neg, no ECTZ identified  UPT neg today  Abd- NT, no masses  Perineum- pink, no lesions  Vulva- white discharge noted at perineum  Vagina- pink, normal rugae and tone. White/brown discharge noted in vaginal vault.   Cervix- pink, nabothian cyst, no lesions, no CMT  Uterus- NSSC  Adenexa- no masses or pain    A: Normal pelvic exam with some brown discharge  Normal wet prep    P: Reviewed normal pelvic exam today, vag cx and GC/Chlam sent.   Unsure etiology of spotting, CNM reviewed possible cervical bleeding or response to infection, wait for lab results to determine more.   Discussed brush not necessary to send with pap, last pap wnl.   Encouraged pt to use condoms consistently for pregnancy and STD protection, offered contraceptive counseling.   RTO AGY 7/15 or PRN.   Horatio Pel, CNM

## 2013-04-14 NOTE — Patient Instructions (Signed)
The Birth Control Menu   How often Average 1st year  Failure Rate Lowest 1st Year Failure Rate   Spermicide   Rhythm method    Pull out    Female condoms    Every sex   Every sex   Every sex   Every sex    28%   25%   24%   18%    18%   5%   4%   2%      Pills    Patch   Vaginal Ring   Injection (DepoProvera)    Every day   Every week   Every month     Every 3 months    9%   9%   9%     6%    0.3%   0.3%   0.3%     0.2%      Copper IUD (ParaGard)   Hormone IUD (Mirena)  Arm Implant (Implanon)      10 years       5 years      3 years      0.8%     0.2%        0.05%       Tubal ligation    Vasectomy     Forever   Forever    0.5%   0.2%       Reviewed 02/2010

## 2013-04-15 LAB — CHLAMYDIA PLASMID DNA AMPLIFICATION: Chlamydia Plasmid DNA Amplification: 0

## 2013-04-15 LAB — VAGINITIS SCREEN: DNA PROBE: Vaginitis Screen:DNA Probe: 0

## 2013-04-15 LAB — N. GONORRHOEAE DNA AMPLIFICATION: N. gonorrhoeae DNA Amplification: 0

## 2013-04-30 ENCOUNTER — Telehealth: Payer: Self-pay

## 2013-04-30 NOTE — Telephone Encounter (Signed)
TC from [place]. She is wanting to begin BCPs, informed her that she should come in for a [facility] visit. The pt is currently without insurance, she is wondering where she can get reduced cost BC. Advised [facility] to [facility] to [facility], she may call back if she decides to come to this office.     Also reviewed her vaginitis screen, all WNL.

## 2013-12-16 ENCOUNTER — Ambulatory Visit: Payer: Self-pay | Admitting: Obstetrics and Gynecology

## 2013-12-22 ENCOUNTER — Ambulatory Visit
Admit: 2013-12-22 | Discharge: 2013-12-22 | Disposition: A | Discharge status: Home / Self Care | Payer: Self-pay | Source: Ambulatory Visit | Admitting: Obstetrics and Gynecology

## 2013-12-22 ENCOUNTER — Encounter: Payer: Self-pay | Admitting: Obstetrics and Gynecology

## 2013-12-22 ENCOUNTER — Ambulatory Visit: Payer: Self-pay | Admitting: Obstetrics and Gynecology

## 2013-12-22 VITALS — BP 120/70 | Ht 67.0 in | Wt 241.0 lb

## 2013-12-22 DIAGNOSIS — A64 Unspecified sexually transmitted disease: Secondary | ICD-10-CM

## 2013-12-22 LAB — VAGINITIS SCREEN: DNA PROBE: Vaginitis Screen:DNA Probe: 0

## 2013-12-22 NOTE — Progress Notes (Signed)
GYN Visit  S: Morrie Sheldonshley is a 28 y.o. G1P1001 who presents for an STD check.  Pt has the following symptoms: none  Pt is single partner, contraception - condoms: 100% of the time Together with partner x several  years   denies knowledge of risky exposure STD History currently at risk    Abuse/Neglect Screen:  No    Patient's medications, allergies, past medical, surgical, social and family histories were reviewed and updated as appropriate.    GYN Hx:   Patient's last menstrual period was 12/07/2013.  Menstrual Cycles: regular every 28-30 days   Hx STIs: CT    Pap smear history:      -Date of most recent pap smear: 11/2012 ; Result: wnl   -Last annual 11/2012     PE:   Filed Vitals:    12/22/13 1520   BP: 120/70   Height: 1.702 m (5\' 7" )   Weight: 109.317 kg (241 lb)       General: well developed and well nourished, in no acute distress  Abdomen: soft, non-tender; bowel sounds normal; no masses,  no organomegaly  Pelvic: normal external genitalia, vulva, vagina, cervix, uterus and adnexa    Wet Prep: no pathogens      A: 28 y.o. female G1P1001 presenting to the office for an acute GYN visit.   1. STD (female)  N. Gonorrhoeae DNA amplification    Chlamydia plasmid DNA amplification    Vaginitis screen: DNA probe    N. Gonorrhoeae DNA amplification    Chlamydia plasmid DNA amplification    Vaginitis screen: DNA probe        P:  1.  HIV declined  2. Pap was not due   3. STI screening: GC, chlamydia, trichomonas, BV, yeast  4. Contraception: condoms: 100% of the time   5. Lab tests/imaging:   6. Referrals:none indicated   7. Problem list reviewed and updated.   8. Reviewed when & how to call, RTO in: a few weeks for annual and prn    Lucienne CapersPamela R Johnmichael Melhorn, CNM

## 2013-12-23 LAB — N. GONORRHOEAE DNA AMPLIFICATION: N. gonorrhoeae DNA Amplification: 0

## 2013-12-23 LAB — CHLAMYDIA PLASMID DNA AMPLIFICATION: Chlamydia Plasmid DNA Amplification: 0

## 2013-12-26 ENCOUNTER — Telehealth: Payer: Self-pay

## 2013-12-26 NOTE — Telephone Encounter (Signed)
[place]. Seeking lab results. Reviewed them with her, all WNL.

## 2014-01-23 ENCOUNTER — Ambulatory Visit: Payer: Self-pay | Admitting: Obstetrics and Gynecology

## 2014-07-20 ENCOUNTER — Ambulatory Visit: Payer: Self-pay | Admitting: Obstetrics and Gynecology

## 2014-07-23 ENCOUNTER — Encounter: Payer: Self-pay | Admitting: Certified Nurse Midwife

## 2014-07-23 ENCOUNTER — Ambulatory Visit: Payer: Self-pay | Admitting: Certified Nurse Midwife

## 2014-07-23 ENCOUNTER — Ambulatory Visit
Admit: 2014-07-23 | Discharge: 2014-07-23 | Disposition: A | Discharge status: Home / Self Care | Payer: Self-pay | Source: Ambulatory Visit | Admitting: Certified Nurse Midwife

## 2014-07-23 VITALS — BP 110/60 | Ht 67.0 in | Wt 255.0 lb

## 2014-07-23 DIAGNOSIS — N898 Other specified noninflammatory disorders of vagina: Secondary | ICD-10-CM

## 2014-07-23 NOTE — Progress Notes (Signed)
Here for increase in discharge.  Has noticed low pelvic pain x 2 d associated with ovulation. Pt is aware of due for AE, but has not had insurance. Desires check for GC/CT and vaginitis while insurance is pending.  Using condoms for bcm, believes she has tried all methods and not found one that worked for her.  O: ext vulva wnl. Mod white discharge in vagina, intact. cx intact. Uterus nontender. Adnexa nontender.  P: GC/CT/vaginitis screen. Declines HIV.   Reviewed normal cyclical changes in discharge and ovulatory sx.    Pt requests metrogel if BV and diflucan if yeast.   rto for AE.  Regina Gates, CNM

## 2014-07-24 ENCOUNTER — Other Ambulatory Visit: Payer: Self-pay | Admitting: Certified Nurse Midwife

## 2014-07-24 ENCOUNTER — Telehealth: Payer: Self-pay

## 2014-07-24 LAB — N. GONORRHOEAE DNA AMPLIFICATION: N. gonorrhoeae DNA Amplification: 0

## 2014-07-24 LAB — VAGINITIS SCREEN: DNA PROBE: Vaginitis Screen:DNA Probe: POSITIVE

## 2014-07-24 LAB — CHLAMYDIA PLASMID DNA AMPLIFICATION: Chlamydia Plasmid DNA Amplification: 0

## 2014-07-24 MED ORDER — METRONIDAZOLE 0.75 % VA GEL *I*
1.0000 | Freq: Every evening | VAGINAL | Status: AC
Start: 2014-07-24 — End: 2014-07-29

## 2014-07-24 NOTE — Telephone Encounter (Signed)
Left message on the pt's voice mail to call the nurses' line back, phone number provided.

## 2014-07-24 NOTE — Telephone Encounter (Signed)
-----   Message from Dianna Rossettiebecca Skovgaard, CNM sent at 07/24/2014  8:27 AM EDT -----  Please call in inform + for BV. rx for metrogel placed. Thanks.

## 2014-07-24 NOTE — Telephone Encounter (Signed)
[place]. Informed her of the +BV, and the treatment called in. Morrie Sheldonshley reports that she does not have health insurance, so will not be able to afford it. Reviewed perineal hygiene.

## 2014-07-27 NOTE — Progress Notes (Signed)
The patient was seen in the office on March 17 and received a referral to see the doctors for a consult visit.  The patient stated that she would be scheduling the the appointment at a later date since she first needed to have her insurance in place before she could schedule the appointment.

## 2014-09-18 ENCOUNTER — Encounter: Payer: Self-pay | Admitting: Obstetrics and Gynecology

## 2014-09-18 ENCOUNTER — Ambulatory Visit
Admit: 2014-09-18 | Discharge: 2014-09-18 | Disposition: A | Discharge status: Home / Self Care | Payer: Self-pay | Source: Ambulatory Visit | Admitting: Obstetrics and Gynecology

## 2014-09-18 ENCOUNTER — Ambulatory Visit: Payer: Self-pay | Admitting: Obstetrics and Gynecology

## 2014-09-18 VITALS — BP 110/70 | Ht 67.0 in | Wt 254.0 lb

## 2014-09-18 DIAGNOSIS — Z0000 Encounter for general adult medical examination without abnormal findings: Secondary | ICD-10-CM

## 2014-09-18 DIAGNOSIS — Z01419 Encounter for gynecological examination (general) (routine) without abnormal findings: Secondary | ICD-10-CM

## 2014-09-18 NOTE — Patient Instructions (Signed)
Healthy Practices for Women of all Ages     In addition to your regular OB/GYN history, physical, cholesterol screening, Pap smear, and other recommended cancer screening tests, we ask that you review this list of healthy practices.  Modifying your daily activities according to these practices may improve your overall health and well-being.  In the event of questions about this list, ask your doctor or health care provider.  Additionally, there are specially written pamphlets available, which offer further information.    Diet and Exercise:  - Limit fat and cholesterol; emphasize fruits, grains and vegetables.  - Consume dairy products or use calcium supplementation for adequate calcium intake (1200 mg or more).  - Add folic acid supplementation 0.4 mg (400 micrograms, present in most daily multivitamins) at least two months before considering pregnancy to reduce the risks of birth defects.  - Participate in regular exercise for 30 minutes at least five times a week, and consider weight training.    Injury Prevention:  - Seat/lap belts should be worn while in a moving car.  - Helmet should be used when using motorcycles, bicycles, roller blades and ATVs or skiing.  - Place approved smoke detectors in your house and replace the batteries twice a year.    - Guns and other firearms should be stored unloaded and in a locked area.  Trigger locks should be used as well.  - Consider CPR training for household members.    Dental Health:  - Schedule regular visits to the dentist.  - Floss and brush with fluoride toothpaste daily.    Immunizations:  - A tetanus/diphtheria booster shot (d/T) is recommended every 10 years.  - An MMR vaccine is recommended for non-pregnant women born after 42 without proof of immunity of documentation of previous immunization.  Adults who are susceptible to varicella (chicken pox) should be vaccinated.  - Influenza vaccine is indicated yearly for women age 28 and older, or at any age based  on medical history and exposure.  Pregnant women over [redacted] weeks gestation should receive the flu vaccine as well.  - Pneumococcal pneumonia vaccine is indicated for age 28 and older once in your life.  - Hepatitis A and/or B vaccines are recommended for high-risk individuals.    Substance Abuse:  - Stop smoking; do not use any other tobacco products.  - Avoid alcohol use when driving, boating, swimming or operating other machinery. Avoid excessive use of alcoholic beverages.  - Recreational drug use (marijuana, cocaine, etc.) is dangerous and can be habit-forming.    Sexual Behavior:  - Be sure to use contraception if pregnancy is not desired.  - Regular use of female or female condoms with spermicide helps prevent STDs.  - Consider HIV testing if:  1. You have had more than one sexual partner.  2. You have had any STDs.  3. You have used intravenous drugs.  4. You have a sexual partner with these (the above) risk factors.  5. Your sexual partner has had female homosexual exposure.  6. You received a blood transfusion during 1978-1985.    Breast Health:  - Breast self-examinations should be done monthly (after your period).  - A mammogram should be done once between ages 28-40, then every 1-2 years based on risk factors.  If there is a strong family history of premenopausal breast cancer, you should start with mammograms earlier than age 28.    Colon Cancer Surveillance:  - Beginning at age 28, stool occult blood screening should  be done annually and/or sigmoidoscopy (scope examination of the colon) every 3-5 years.    Women and Alcohol  For women, alcohol use carries some unique and special concerns. Most studies on alcohol have been on men, but we now know women may respond differently to alcohol.   Concerns: Liver damage, cancer, menstrual cycle changes, birth defects, fetal alcohol syndrome, intoxication, nutrition and weight management.  All women should avoid all alcohol during pregnancy.  Help for Problem  Drinking:   One in every three members of Alcoholics Anonymous (AA) is now a woman.  AA meetings are held regularly and have led the way in demonstrating the effectiveness of self-help.   Outpatient and Inpatient Treatment are also available.  Ask your health care provider for a referral.   The first step in successful treatment is to recognize that there is a problem and accept help.    Dietary and Supplemental Calcium  It is important that you build and protect your bone mass through a program of regular exercise and proper nutrition with adequate amounts of calcium in your diet. Dairy products are considered to be the most important sources of calcium.    Reviewed 02/2010
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
## 2014-09-19 LAB — VAGINITIS SCREEN: DNA PROBE: Vaginitis Screen:DNA Probe: POSITIVE

## 2014-09-21 ENCOUNTER — Ambulatory Visit
Admit: 2014-09-21 | Discharge: 2014-09-21 | Disposition: A | Discharge status: Home / Self Care | Payer: Self-pay | Source: Ambulatory Visit | Admitting: Obstetrics and Gynecology

## 2014-09-21 ENCOUNTER — Ambulatory Visit: Payer: Self-pay | Admitting: Obstetrics & Gynecology

## 2014-09-21 ENCOUNTER — Encounter: Payer: Self-pay | Admitting: Obstetrics & Gynecology

## 2014-09-21 ENCOUNTER — Telehealth: Payer: Self-pay | Admitting: Obstetrics & Gynecology

## 2014-09-21 VITALS — BP 100/74 | Ht 67.01 in | Wt 260.4 lb

## 2014-09-21 DIAGNOSIS — Z3009 Encounter for other general counseling and advice on contraception: Secondary | ICD-10-CM

## 2014-09-21 LAB — CHLAMYDIA PLASMID DNA AMPLIFICATION: Chlamydia Plasmid DNA Amplification: 0

## 2014-09-21 LAB — N. GONORRHOEAE DNA AMPLIFICATION: N. gonorrhoeae DNA Amplification: 0

## 2014-09-21 NOTE — Telephone Encounter (Signed)
[facility] Surgical Scheduling Form - short version     Preoperative visit and consent needs (select from the following options, delete any that do not apply):    H&P:    Was the pre-op H&P performed already?  NO    If not, please aim to scheduled with    [name]   (provider) within 30 days of surgery   Consents:    Consent needs to be obtained.    Medicaid BTL consent signed 09/21/2014.    1. Surgeon (attending): Marwa Ibrahim   A. Resident (no or yes/name of resident continuity provider): [name] if available    2. Surgical Procedure: laparoscopic bilateral tubal ligation    3. Equipment/Needs: filshie clips   Rep needed (no or yes/name of rep if known): no    4. Consults and Testing (answer yes or no to each of the following):   Anesthesia Consult: no    Blood Bank Needs: no    EKG: no   XRAY: no    Scheduled Lab/EKGTests (indicate testing ordered): no       Routed to surgical scheduler and case request order completed.  [name], MD  09/21/2014

## 2014-09-21 NOTE — Patient Instructions (Signed)
Laparoscopic Tubal Ligation - Permanent Birth Control [facility]    How does a tubal ligation work?  A fallopian tube is attached to each side of your uterus (womb). Tubal ligation is surgery to close your fallopian tubes. It is also called female sterilization or having your "tubes tied". Your surgeon uses a laparoscope to do the surgery. This scope is a long metal tube with a magnifying camera and a light on the end. It is put into your abdomen through one or more small incisions (cuts).     How is a tubal ligation performed?  During a tubal ligation, your fallopian tubes are burned shut, cut, or closed with a type of clip. Immediately after your tubes are closed, sperm will not be able to reach an egg and cause pregnancy. A tubal ligation is an effective and permanent (lifelong) form of birth control.    Before having this surgery, you must be sure that you never want to become pregnant in the future. You will still have monthly periods after your tubal ligation. A tubal ligation will not protect you from sexually transmitted diseases.    Are there any side effects or risks?  There are always risks with surgery. During any surgery, you may bleed more than usual, have trouble breathing, or get an infection. Blood vessels or organs such as your bowel or bladder could be injured during surgery. Although pregnancy is unlikely after a tubal ligation, there is a small chance of it. If pregnancy does occur, there is an increased risk of having an ectopic pregnancy (pregnancy in the tubes). A tubal ligation can be reversed but it does not mean you will be able to get pregnant again.     What are the other options?  Another permanent method of birth control is Essure, which is placing small coils in the fallopian tubes to block them. Other effective non-surgical methods include IUDs (intrauterine devices) and implants (such as Nexplanon).  These non-surgical methods are just as effective as tying your  tubes.    Getting ready for surgery    The week before your surgery:   Ask your doctor if you need to stop taking any prescribed or over-the-counter (OTC) medicine before your surgery. Medicines you may need to stop taking include aspirin, ibuprofen, or prescription blood thinners. Do not stop any of your medications without asking a doctor or nurse first.     You may need to have tests done before the surgery, such as blood tests. Ask your caregiver for more information about these and other tests that you may need. Write down the date, time, and location of each test.    Arrange to have a family member or friend drive you home after surgery.      The night before your surgery:   Your stomach needs to be completely empty (no food or water) for 6 to 12 hours before your surgery.    If you have diabetes, ask your caregiver for special instructions about what you may eat and drink before your surgery. If you use medicine to treat diabetes, your caregiver may have special instructions about using it before surgery. You may need to check your blood sugar more often before and after having surgery.     The day of your surgery:   Write down the correct date, time, and location of your surgery.    Medicines: Ask your caregiver before using any medicine on the day of your surgery. If you do   need to take medicine by mouth on the day of your surgery, take it with as little water as possible. Bring a list of your medicines or the containers with you to the hospital.     Bathing: Take a complete bath or shower and wash your hair before your surgery. You may not be able to fully bathe until a few days after the surgery. Remove any nail polish.    Contacts, dentures, and hearing aids: Do not wear contact lenses the day of your surgery. You may wear your glasses. If you regularly wear dentures or hearing aids, wear them to the hospital. Your caregivers will need for you to hear them, and talk to them clearly before the surgery.  You may have to remove them before going to the operating room.     Informed consent: You have the right to understand your health condition in words that you   know. You should be told what tests, treatments, or procedures may be done to treat your condition. Your doctor should also tell you about the risks and benefits of each treatment. You may be asked to sign a consent form that gives caregivers permission to do certain tests, treatments, or procedures. If you are unable to give your consent, someone who has permission can sign this form for you. A consent form is a legal piece of paper that tells exactly what will be done to you. Before giving your consent, make sure all your questions have been answered so that you understand what may happen.     After your surgery: You will be taken to a recovery room. There, caregivers will watch you closely until the anesthesia wears off. You will most likely go home the same day. A bandage will cover the staples or stitches closing the incisions in your abdomen.   Avoid any tampons, douching or intercourse for the first 2-4 weeks after surgery. You may be given pain medications afterward; do not drive if you are taking narcotic painkillers.
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
## 2014-09-24 ENCOUNTER — Ambulatory Visit: Payer: Self-pay | Admitting: Orthopedic Surgery

## 2014-09-24 LAB — GYN CYTOLOGY

## 2014-09-25 NOTE — Progress Notes (Signed)
This encounter was created in error - please disregard.

## 2014-09-27 ENCOUNTER — Telehealth: Payer: Self-pay | Admitting: Obstetrics and Gynecology

## 2014-09-27 NOTE — Telephone Encounter (Signed)
PC to [facility] to review lab results. Unable to leave message at phone number in chart.

## 2014-10-10 ENCOUNTER — Telehealth: Payer: Self-pay | Admitting: Obstetrics and Gynecology

## 2014-10-10 MED ORDER — LEVONORGESTREL 1.5 MG PO TABS *I*
1.5000 mg | ORAL_TABLET | Freq: Once | ORAL | Status: AC
Start: 2014-10-10 — End: 2014-10-10

## 2014-10-10 NOTE — Telephone Encounter (Signed)
Tc from patient requesting morning after pill

## 2014-10-14 ENCOUNTER — Telehealth: Payer: Self-pay

## 2014-10-14 NOTE — Telephone Encounter (Signed)
T/C from patient. Wondering when she is going to hear from someone about scheduling her BTL. Per Mick SellLaKeisha, schedule is still being negotiated. Patient will get call when schedule is in place. Patient requesting Rx for OCPs while waiting for surgery. Patient is requesting "low dose" OCP. Routed to providers. Donato HeinzKatherine Lakota Schweppe RN

## 2014-10-14 NOTE — Telephone Encounter (Signed)
Pt is a midwife pt, only being seen for consult for BTL by [facility], this call needs to be directed to the midwifery pool, thank you

## 2014-10-15 ENCOUNTER — Other Ambulatory Visit: Payer: Self-pay | Admitting: Obstetrics and Gynecology

## 2014-10-15 MED ORDER — DESOGESTREL-ETHINYL ESTRADIOL 0.15-0.02/0.01 MG (21/5) PO TABS *A*
1.0000 | ORAL_TABLET | Freq: Every day | ORAL | 5 refills | Status: DC
Start: 2014-10-15 — End: 2014-11-16

## 2014-10-15 NOTE — Progress Notes (Signed)
Rx sent for Regina Gates to pharmacy.

## 2014-10-17 ENCOUNTER — Telehealth: Payer: Self-pay | Admitting: Obstetrics and Gynecology

## 2014-10-17 MED ORDER — LEVONORGESTREL 1.5 MG PO TABS *I*
1.5000 mg | ORAL_TABLET | Freq: Once | ORAL | 0 refills | Status: AC
Start: 2014-10-17 — End: 2014-10-17

## 2014-10-17 NOTE — Telephone Encounter (Signed)
Pt states she had unprotected on 10/15/14. Has not started [place]. Rx sent to pharmacy for Plan B. Advised pt to use condoms until she starts her Garnette Scheuermann. To call if no menses later this month.

## 2014-10-19 NOTE — Telephone Encounter (Addendum)
Surgical Scheduler's Documentation    Pre-op appt date: 11/17/14 at 11am with Dr. Evert Kohl.  Surgery appt date: 11/25/14 at 8:45 with Dr. Marquis Lunch.  Post-op appt date: pending.    [facility] calendar request with surgical date/time sent to attending(s) Dr. Marquis Lunch 10/08/14.     Insurance: MCD  Insurance authorization # for the procedure: not needed    Patient gives the following back-up phone numbers for contact:  Alternate phone: [phone]  Emergency contact name: None.  Number: .    Counseling:  Patient contacted on 10/19/14.  Writer was able to reach patient.      She is aware of date of pre-op and surgery.      She expressed understanding of pre-op preparation needs, including but not limited to NPO status after midnight on the evening preceding surgery.       Mick Sell [facility]  10/19/2014  10:10 AM

## 2014-11-16 NOTE — Telephone (Signed)
Pre-Operative Instructions Record                    [facility] [id number] MR    PRIOR TO SURGERY    Starting 7/15, you must STOP all aspirin, ibuprofen (Advil, Naproxen, Aleve, Motrin, etc.) and all vitamins and herbal supplements. YOU MAY TAKE ACETAMINOPHEN (TYLENOL).    FOLLOW YOUR SURGEON'S INSTRUCTIONS IF DIFFERENT THAN ABOVE.        DAY BEFORE SURGERY 7/19    Call [phone] between 1PM and 4PM and select option #1 to receive your arrival/surgery time.        Do not eat anything (including candy or gum) after midnight the night before your surgery.  ____________________________________________________________________________    DAY OF SURGERY 7/20    Up to 4 hours before your surgery time, clear liquids are allowed (unless your doctor tells you differently).  Examples: black coffee or tea (no dairy or non-dairy creamer), soda, water, clear apple or cranberry juice.  No orange or tomato juice.     MEDICATIONS   PLEASE REFER TO THE MEDICATION LIST ON THE ATTACHED PAGE AND ONLY TAKE THE MEDICATIONS MARKED ON THAT LIST.   DO NOT TAKE ANY MEDICATIONS THAT WERE ALREADY STOPPED (ABOVE).   Bring and use inhalers as needed.   Pain and anxiety medications may be taken with a sip of water at any time.        DO NOT WEAR ANY RINGS, JEWELRY, MAKEUP, DARK NAIL POLISH, HAIR PINS, BODY LOTION OR SCENTS.  You may brush your teeth and use deodorant.  If wearing eyeglasses, please bring a case.  DO NOT WEAR CONTACT LENSES.     __ BRING CPAP IF USING       __ Skin prep and instructions given       __ Face wash       __ Bring braces  ________________________________________________________________________________    AT THE HOSPITAL  Park in the Main Ramp garage.  [facility] on [address].  Leave your belongings in the car and your visitors can bring them to your room after surgery.    Any questions? Call [phone],  select option 1, and ask to speak with a nurse or call your surgeon.    The patient has participated in the development of this discharge plan and the above material has been reviewed.  Questions have been answered and he/she understands the contents of this plan and has received a copy of it.  Claudine MoutonPatricia A Sultana Tierney, RN 11/16/2014 10:13 AM        ON THE DAY OF YOUR SURGERY, FROM THE LIST OF MEDICATIONS BELOW, TAKE ONLY THOSE MEDICATIONS CHECKED.

## 2014-11-16 NOTE — Plan of Care (Signed)
Problem: Knowledge deficit related to pre or post-op regimens  Goal: Patient verbalizes understanding of PACU teaching  Outcome: Completed or Resolved Date Met:  11/16/14

## 2014-11-17 ENCOUNTER — Encounter: Payer: Self-pay | Admitting: Student in an Organized Health Care Education/Training Program

## 2014-11-17 ENCOUNTER — Ambulatory Visit
Admit: 2014-11-17 | Discharge: 2014-11-17 | Disposition: A | Discharge status: Home / Self Care | Payer: Self-pay | Source: Ambulatory Visit | Attending: Student in an Organized Health Care Education/Training Program | Admitting: Student in an Organized Health Care Education/Training Program

## 2014-11-17 ENCOUNTER — Ambulatory Visit
Admit: 2014-11-17 | Discharge: 2014-11-17 | Disposition: A | Discharge status: Home / Self Care | Payer: Self-pay | Source: Ambulatory Visit | Attending: Gastroenterology | Admitting: Gastroenterology

## 2014-11-17 ENCOUNTER — Ambulatory Visit: Payer: Self-pay | Admitting: Student in an Organized Health Care Education/Training Program

## 2014-11-17 ENCOUNTER — Ambulatory Visit
Admit: 2014-11-17 | Discharge: 2014-11-17 | Disposition: A | Discharge status: Home / Self Care | Payer: Self-pay | Source: Ambulatory Visit | Admitting: Obstetrics and Gynecology

## 2014-11-17 VITALS — BP 116/83 | Ht 67.01 in | Wt 257.2 lb

## 2014-11-17 DIAGNOSIS — Z3009 Encounter for other general counseling and advice on contraception: Secondary | ICD-10-CM

## 2014-11-17 DIAGNOSIS — Z01419 Encounter for gynecological examination (general) (routine) without abnormal findings: Secondary | ICD-10-CM

## 2014-11-17 LAB — CBC
Hematocrit: 40 % (ref 34–45)
Hemoglobin: 12.4 g/dL (ref 11.2–15.7)
MCH: 28 pg/cell (ref 26–32)
MCHC: 31 g/dL — ABNORMAL LOW (ref 32–36)
MCV: 89 fL (ref 79–95)
Platelets: 328 10*3/uL (ref 160–370)
RBC: 4.5 MIL/uL (ref 3.9–5.2)
RDW: 14.8 % — ABNORMAL HIGH (ref 11.7–14.4)
WBC: 9.3 10*3/uL (ref 4.0–10.0)

## 2014-11-17 LAB — MULTIPLE ORDERING DOCS

## 2014-11-17 LAB — TYPE AND SCREEN
ABO RH Blood Type: O POS
Antibody Screen: NEGATIVE

## 2014-11-17 MED ORDER — OXYCODONE-ACETAMINOPHEN 5-325 MG PO TABS *I*
1.0000 | ORAL_TABLET | Freq: Four times a day (QID) | ORAL | 0 refills | Status: DC | PRN
Start: 2014-11-17 — End: 2015-07-14

## 2014-11-17 MED ORDER — DOCUSATE SODIUM 100 MG PO CAPS *I*
100.0000 mg | ORAL_CAPSULE | Freq: Two times a day (BID) | ORAL | 0 refills | Status: DC
Start: 2014-11-17 — End: 2015-07-14
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
## 2014-11-17 NOTE — Patient Instructions (Signed)
Nothing to eat or drink after midnight before your surgery.

## 2014-11-18 LAB — HEPATITIS B SURFACE ANTIGEN: HBV S Ag: NEGATIVE

## 2014-11-18 LAB — HIV 1&2 ANTIGEN/ANTIBODY: HIV 1&2 ANTIGEN/ANTIBODY: NONREACTIVE

## 2014-11-18 LAB — SYPHILIS SCREEN
Syphilis Screen: NEGATIVE
Syphilis Status: NONREACTIVE

## 2014-11-18 LAB — HEPATITIS C ANTIBODY: Hep C Ab: NEGATIVE

## 2014-11-24 NOTE — Anesthesia Preprocedure Evaluation (Addendum)
Anesthesia Pre-operative History and Physical for Regina Gates    ______________________________________________________________________________________    Summary:  29 yo woman for laparoscopic BTL.  By Leida LauthALISON Sandeep Radell, MD at 12:59 PM on 11/24/2014    Proceduralist's Site Exam (from Proceduralist's notes):  29 y.o. female with Sterilization consult (Z30.09) presenting for Procedure(s):  LAPAROSCOPIC BTL w/ filshie clips by Surgeon(s):  Judith BlonderIbrahim, Marwa, MD  [facility] Resident, Resident scheduled for 75 minutes.        Anesthesia Evaluation Information Source: records, patient, family     ANESTHESIA     Denies anesthesia history  Pertinent(-):  history of anesthetic complications, Family Hx of Anesthetic Complications    GENERAL    + Obesity  Pertinent (-):  history of anesthetic complications, Family Hx of Anesthetic Complications    HEENT     Denies HEENT issues PULMONARY    + Smoker            currently, advised to quit  Pertinent(-): asthma, sleep apnea    CARDIOVASCULAR     Denies cardiovascular issues  Pertinent(-):  hypertension, valvular heart disease    GI/HEPATIC/RENAL  Last PO Intake: >8hr before procedure and >2hr before procedure (clears)    + GERD (sometimes)  Pertinent(-):  liver  issues, renal issues NEURO/PSYCH    + Psychiatric Issues          depression  Pertinent(-):  seizures, cerebrovascular event    Comment: scoliosis    ENDO/OTHER     Denies endo issues  Pertinent(-):  diabetes mellitus    HEMALOGIC     Denies hematologic issues       Physical Exam    Airway            Mouth opening: normal            Mallampati: II            TM distance (fb): >3 FB            TM distance (cm): >5  Dental    Comment: "I have bad teeth" per pt, one broken tooth right back   Cardiovascular           Rhythm: regular           Rate: normal       Pulmonary     breath sounds clear to auscultation    Mental Status     oriented to person, place and time    + Anxious        ________________________________________________________________________  Plan  ASA Score  3  Anesthetic Plan general    Induction (routine IV); General Anesthesia/Sedation Maintenance Plan (inhaled agents);  Airway Manipulation (direct laryngoscopy); Airway (cuffed ETT); Line ( use current access); Monitoring (standard ASA); Positioning (lithotomy); PONV Plan (dexamethasone and ondansetron); Pain (per surgical team); PostOp (PACU)    Informed Consent     Risks:            Comment: Nausea, vomiting, dental/mouth/lip injury, sore throat, pain, esophageal injury (pt asked), "something terrible" d/w pt and her 2 female companions.  Pt is very nervous about being put to sleep and the tube but does agree to proceed.    Anesthetic Consent:      Anesthetic plan (and risks as noted above) were discussed with patient    Plan also discussed with team members including:  surgeon    Attending Attestation:  As the primary attending anesthesiologist, I attest that the patient or proxy understands and accepts the risks and  benefits of the anesthesia plan. I also attest that I have personally performed a pre-anesthetic examination and evaluation, and prescribed the anesthetic plan for this particular location within 48 hours prior to the anesthetic as documented.

## 2014-11-24 NOTE — Progress Notes (Signed)
Normal labs, no further action @ this time.

## 2014-11-25 ENCOUNTER — Ambulatory Visit
Admit: 2014-11-25 | Disposition: A | Discharge status: Home / Self Care | Payer: Self-pay | Source: Ambulatory Visit | Attending: Obstetrics and Gynecology | Admitting: Obstetrics and Gynecology

## 2014-11-25 ENCOUNTER — Encounter: Payer: Self-pay | Admitting: Obstetrics and Gynecology

## 2014-11-25 ENCOUNTER — Encounter: Payer: Self-pay | Admitting: Family Medicine

## 2014-11-25 ENCOUNTER — Ambulatory Visit: Payer: Self-pay

## 2014-11-25 ENCOUNTER — Encounter
Disposition: A | Discharge status: Home / Self Care | Payer: Self-pay | Source: Ambulatory Visit | Attending: Obstetrics and Gynecology

## 2014-11-25 DIAGNOSIS — Z9851 Tubal ligation status: Secondary | ICD-10-CM

## 2014-11-25 HISTORY — DX: Tubal ligation status: Z98.51

## 2014-11-25 HISTORY — PX: PR LAP,TUBAL BLOCK BY DEVICE: 58671

## 2014-11-25 HISTORY — PX: PR LAPAROSCOPY W/PLMT OCCLUSION DEVICE OVIDUCTS: 58671

## 2014-11-25 LAB — POCT URINE PREGNANCY: Lot #: 114191

## 2014-11-25 SURGERY — LIGATION, FALLOPIAN TUBE, BILATERAL, LAPAROSCOPIC
Anesthesia: General | Site: Abdomen | Laterality: Bilateral | Wound class: Clean Contaminated

## 2014-11-25 MED ORDER — HALOPERIDOL LACTATE 5 MG/ML IJ SOLN *I*
0.5000 mg | Freq: Once | INTRAMUSCULAR | Status: DC | PRN
Start: 2014-11-25 — End: 2014-11-25

## 2014-11-25 MED ORDER — HYDROMORPHONE HCL PF 1 MG/ML IJ SOLN *WRAPPED*
INTRAMUSCULAR | Status: DC | PRN
Start: 2014-11-25 — End: 2014-11-25
  Administered 2014-11-25 (×2): 0.5 mg via INTRAVENOUS

## 2014-11-25 MED ORDER — BUPIVACAINE HCL 0.25 % IJ SOLUTION *WRAPPED*
Status: DC | PRN
Start: 2014-11-25 — End: 2014-11-25
  Administered 2014-11-25: 9 mL via SUBCUTANEOUS

## 2014-11-25 MED ORDER — ATRACURIUM BESYLATE 50 MG/5ML IV SOLN *WRAPPED*
INTRAVENOUS | Status: DC | PRN
Start: 2014-11-25 — End: 2014-11-25
  Administered 2014-11-25: 15 mg via INTRAVENOUS
  Administered 2014-11-25: 5 mg via INTRAVENOUS

## 2014-11-25 MED ORDER — ATRACURIUM BESYLATE 50 MG/5ML IV SOLN *WRAPPED*
INTRAVENOUS | Status: AC
Start: 2014-11-25 — End: 2014-11-25
  Filled 2014-11-25: qty 5

## 2014-11-25 MED ORDER — MIDAZOLAM HCL 1 MG/ML IJ SOLN *I* WRAPPED
INTRAMUSCULAR | Status: AC
Start: 2014-11-25 — End: 2014-11-25
  Filled 2014-11-25: qty 2

## 2014-11-25 MED ORDER — NEOSTIGMINE METHYLSULFATE 10 MG/10ML IV SOLN *I*
INTRAVENOUS | Status: AC
Start: 2014-11-25 — End: 2014-11-25
  Filled 2014-11-25: qty 6

## 2014-11-25 MED ORDER — BUPIVACAINE HCL 0.25 % IJ SOLUTION *WRAPPED*
Status: AC
Start: 2014-11-25 — End: 2014-11-25
  Filled 2014-11-25: qty 30

## 2014-11-25 MED ORDER — LACTATED RINGERS IV SOLN *I*
155.0000 mL/h | INTRAVENOUS | Status: DC
Start: 2014-11-25 — End: 2014-11-25
  Administered 2014-11-25: 155 mL/h via INTRAVENOUS

## 2014-11-25 MED ORDER — SUCCINYLCHOLINE CHLORIDE 20 MG/ML IV/IJ SOLN *WRAPPED*
Status: AC
Start: 2014-11-25 — End: 2014-11-25
  Filled 2014-11-25: qty 10

## 2014-11-25 MED ORDER — MIDAZOLAM HCL 1 MG/ML IJ SOLN *I* WRAPPED
INTRAMUSCULAR | Status: DC | PRN
Start: 2014-11-25 — End: 2014-11-25
  Administered 2014-11-25: 2 mg via INTRAVENOUS

## 2014-11-25 MED ORDER — HYDROMORPHONE HCL PF 1 MG/ML IJ SOLN *WRAPPED*
INTRAMUSCULAR | Status: AC
Start: 2014-11-25 — End: 2014-11-25
  Filled 2014-11-25: qty 1

## 2014-11-25 MED ORDER — MEPERIDINE HCL 25 MG/ML IJ SOLN *I*
6.2500 mg | Freq: Once | INTRAMUSCULAR | Status: DC | PRN
Start: 2014-11-25 — End: 2014-11-25

## 2014-11-25 MED ORDER — KETOROLAC TROMETHAMINE 30 MG/ML IJ SOLN *I*
INTRAMUSCULAR | Status: AC
Start: 2014-11-25 — End: 2014-11-25
  Filled 2014-11-25: qty 1

## 2014-11-25 MED ORDER — SUCCINYLCHOLINE CHLORIDE 20 MG/ML IV/IJ SOLN *WRAPPED*
Status: DC | PRN
Start: 2014-11-25 — End: 2014-11-25
  Administered 2014-11-25: 140 mg via INTRAVENOUS

## 2014-11-25 MED ORDER — PROPOFOL 10 MG/ML IV EMUL (INTERMITTENT DOSING) WRAPPED *I*
INTRAVENOUS | Status: AC
Start: 2014-11-25 — End: 2014-11-25
  Filled 2014-11-25: qty 40

## 2014-11-25 MED ORDER — GLYCOPYRROLATE 0.2 MG/ML IJ SOLN *I*
INTRAMUSCULAR | Status: DC | PRN
Start: 2014-11-25 — End: 2014-11-25
  Administered 2014-11-25: 0.6 mg via INTRAVENOUS

## 2014-11-25 MED ORDER — LIDOCAINE HCL 1 % IJ SOLN *I*
0.1000 mL | INTRAMUSCULAR | Status: DC | PRN
Start: 2014-11-25 — End: 2014-11-25
  Administered 2014-11-25: 0.1 mL via SUBCUTANEOUS

## 2014-11-25 MED ORDER — OXYCODONE-ACETAMINOPHEN 5-325 MG PO TABS *I*
1.0000 | ORAL_TABLET | Freq: Once | ORAL | Status: AC | PRN
Start: 2014-11-25 — End: 2014-11-25
  Administered 2014-11-25: 1 via ORAL
  Filled 2014-11-25: qty 1

## 2014-11-25 MED ORDER — PROPOFOL 10 MG/ML IV EMUL (INTERMITTENT DOSING) WRAPPED *I*
INTRAVENOUS | Status: DC | PRN
Start: 2014-11-25 — End: 2014-11-25
  Administered 2014-11-25: 300 mg via INTRAVENOUS

## 2014-11-25 MED ORDER — ONDANSETRON HCL 2 MG/ML IV SOLN *I*
INTRAMUSCULAR | Status: DC | PRN
Start: 2014-11-25 — End: 2014-11-25
  Administered 2014-11-25: 4 mg via INTRAMUSCULAR

## 2014-11-25 MED ORDER — KETOROLAC TROMETHAMINE 30 MG/ML IJ SOLN *I*
INTRAMUSCULAR | Status: DC | PRN
Start: 2014-11-25 — End: 2014-11-25
  Administered 2014-11-25: 30 mg via INTRAVENOUS

## 2014-11-25 MED ORDER — GLYCOPYRROLATE 0.2 MG/ML IJ SOLN *WRAPPED*
INTRAMUSCULAR | Status: AC
Start: 2014-11-25 — End: 2014-11-25
  Filled 2014-11-25: qty 6

## 2014-11-25 MED ORDER — SODIUM CHLORIDE 0.9 % IV SOLN WRAPPED *I*
20.0000 mL/h | Status: DC
Start: 2014-11-25 — End: 2014-11-25

## 2014-11-25 MED ORDER — PROMETHAZINE HCL 25 MG/ML IJ SOLN *I*
6.2500 mg | Freq: Once | INTRAMUSCULAR | Status: AC | PRN
Start: 2014-11-25 — End: 2014-11-25
  Administered 2014-11-25: 6.25 mg via INTRAVENOUS
  Filled 2014-11-25: qty 1

## 2014-11-25 MED ORDER — NEOSTIGMINE METHYLSULFATE 10 MG/10ML IV SOLN *I*
INTRAVENOUS | Status: DC | PRN
Start: 2014-11-25 — End: 2014-11-25
  Administered 2014-11-25: 3 mg via INTRAVENOUS

## 2014-11-25 MED ORDER — LIDOCAINE HCL 2 % (PF) IJ SOLN *I*
INTRAMUSCULAR | Status: AC
Start: 2014-11-25 — End: 2014-11-25
  Filled 2014-11-25: qty 5

## 2014-11-25 MED ORDER — DEXAMETHASONE SODIUM PHOSPHATE 4 MG/ML INJ SOLN *WRAPPED*
INTRAMUSCULAR | Status: AC
Start: 2014-11-25 — End: 2014-11-25
  Filled 2014-11-25: qty 1

## 2014-11-25 MED ORDER — ONDANSETRON HCL 2 MG/ML IV SOLN *I*
INTRAMUSCULAR | Status: AC
Start: 2014-11-25 — End: 2014-11-25
  Filled 2014-11-25: qty 2

## 2014-11-25 MED ORDER — LIDOCAINE HCL 2 % IJ SOLN *I*
INTRAMUSCULAR | Status: DC | PRN
Start: 2014-11-25 — End: 2014-11-25
  Administered 2014-11-25: 100 mg via INTRAVENOUS

## 2014-11-25 MED ORDER — DEXAMETHASONE SODIUM PHOSPHATE 4 MG/ML INJ SOLN *WRAPPED*
INTRAMUSCULAR | Status: DC | PRN
Start: 2014-11-25 — End: 2014-11-25
  Administered 2014-11-25: 4 mg via INTRAVENOUS

## 2014-11-25 MED ORDER — LACTATED RINGERS IV SOLN *I*
20.0000 mL/h | INTRAVENOUS | Status: DC
Start: 2014-11-25 — End: 2014-11-25
  Administered 2014-11-25: 20 mL/h via INTRAVENOUS

## 2014-11-25 MED ORDER — HYDROMORPHONE HCL PF 1 MG/ML IJ SOLN *WRAPPED*
0.4000 mg | INTRAMUSCULAR | Status: DC | PRN
Start: 2014-11-25 — End: 2014-11-25
  Administered 2014-11-25 (×4): 0.4 mg via INTRAVENOUS
  Filled 2014-11-25 (×3): qty 1

## 2014-11-25 SURGICAL SUPPLY — 33 items
ADHESIVE DERMABOND GLUE HI VISCOSITY (Dressing) ×1
ADHESIVE MASTISOL 2/3CC VIAL (Dressing) IMPLANT
ADHESIVE SKIN CLOSURE 0.7ML DERMABOND ADVANCED (Dressing) ×2 IMPLANT
APPLICATOR CHLORAPREP 26ML ORANGE LARGE (Solution) ×3 IMPLANT
BRIEF MATERNITY MESH 2XL LF (Dressing) ×3 IMPLANT
CLIP FILSHIE (Implant) ×2 IMPLANT
CLIP INT CLS W3.5XH4XL13MM TBL LIG SYS CONTRACEPTIVE OCCL DEV NONMAGNETIC (Implant) ×1 IMPLANT
CLOSURE STERI-STRIP REINF .5 X 4IN LF (Dressing) IMPLANT
CORD BIPOLAR HIGH FREQUENCY 10FT DISP (Supply) IMPLANT
DRAPE SHEET 53X77 (Drape) ×3
DRAPE SUR 3 QTR W53XL77IN SMS POLYPR ~~LOC~~ (Drape) ×2 IMPLANT
GLOVE SURG PROTEXIS PI 6.0 PF SYN (Glove) ×9 IMPLANT
GLOVE SURG PROTEXIS PI 6.5 PF SYN (Glove) ×11 IMPLANT
GOWN SIRUS FABRIC REINFORCED SET IN XL (Gown) ×6 IMPLANT
HANDPIECE S/I 5MM X 32CM CANNULA DISP (Supply) IMPLANT
INSTRUMENT LIGASURE BLUNT TIP 5MM (Supply) IMPLANT
NEEDLE HYPO BVL LF 25G X 1.5IN (Needle) ×3 IMPLANT
NEEDLE PNEUMOPERITONEUM 120 (Needle) IMPLANT
PACK CUSTOM GYN ENDOSCOPY PK (Pack) ×3 IMPLANT
SEALER LAP L37CM BLUNT TIP 5MM LONG CUTTING TIP LIGASURE (Supply) IMPLANT
SLEEVE XCEL ENDOPATH 5 X 100MM (Other) IMPLANT
SOL H2O IRRIG STER 1000ML BTL (Solution)
SOL SOD CHL IRRIG 1000ML BTL (Solution)
SOL SODIUM CHLORIDE IRRIG 1000ML BTL (Solution) ×2 IMPLANT
SOL WATER IRRIG STERILE 1000ML BTL (Solution) IMPLANT
SUTR VICRYL CTD3-0 PS-2 UNDY (Suture) ×6 IMPLANT
SYRINGE LUERLOCK CNTL 10CC (Supply) ×3 IMPLANT
TRAY FOLEY SIL DRAIN BAG SECURE 14 FR (Other) ×3 IMPLANT
TRAY PATIENT PREP (Tray) ×2
TRAY PREP SKIN INCL 8 DRY GZ PD TWO 6IN COT TIP APPL 2 STK SPNG (Tray) ×1 IMPLANT
TROCAR XCEL ENDOPATH BLADELESS 5X100MM (Other) ×3 IMPLANT
TROCAR XCEL ENDOPATH BLADELESS 8X100MM (Other) ×3 IMPLANT
TUBING INSUFLATION FOR CO2 (Tubing) ×3 IMPLANT

## 2014-11-25 NOTE — Anesthesia Procedure Notes (Signed)
---------------------------------------------------------------------------------------------------------------------------------------    AIRWAY   GENERAL INFORMATION AND STAFF    Patient location during procedure: OR       Date of Procedure: 11/25/2014 9:12 AM  AIRWAY METHOD     Preoxygenated: yes      Induction: IV  Mask Difficulty Assessment:  0 - not attempted      Technique Used for Successful ETT Placement:  Direct laryngoscopy    Blade Type:  Miller    Laryngoscope Blade/Video laryngoscope Blade Size:  2    Cormack-Lehane Classification:  Grade I - full view of glottis    Placement Verified by: capnometry, auscultation and equal breath sounds      Number of Attempts at Approach:  1  FINAL AIRWAY DETAILS    Final Airway Type:  Endotracheal airway    Final Endotracheal Airway:  ETT      Cuffed: cuffed    Insertion Site:  Oral    ETT Size (mm):  7.5    Distance inserted from Teeth (cm):  24  ----------------------------------------------------------------------------------------------------------------------------------------

## 2014-11-25 NOTE — Progress Notes (Signed)
UPDATES TO PATIENT'S CONDITION on the DAY OF SURGERY/PROCEDURE    I. Updates to Patient's Condition (to be completed by a provider privileged to complete a H&P, following reassessment of the patient by the provider):      II. Procedure Readiness   I have reviewed the patient's H&P and updated condition. By completing and signing this form, I attest that this patient is ready for surgery/procedure.      III. Attestation   I have reviewed the updated information regarding the patient's condition and it is appropriate to proceed with the planned surgery/procedure.    Judith BlonderMarwa Tyjai Matuszak, MD as of 8:50 AM 11/25/2014

## 2014-11-25 NOTE — Anesthesia Postprocedure Evaluation (Signed)
Anesthesia Post-Op Note    Patient: Regina Gates    Procedure(s) Performed:  Procedure Summary     Date Anesthesia Start Anesthesia Stop Room / Location    11/25/14 [phone] [id number] / [facility] MAIN OR       Procedure Diagnosis Surgeon Attending Anesthesia    LAPAROSCOPIC BTL w/ filshie clips (Bilateral Abdomen) Request for sterilization  (Sterilization consult [Z30.09]) Judith BlonderIbrahim, Marwa, MD Leida LauthVogt, Tabius Rood, MD        Recovery Vitals  BP: 114/72 (11/25/2014 10:42 AM)  Heart Rate: 63 (11/25/2014 10:42 AM)  Heart Rate (via Pulse Ox): 78 (11/25/2014 10:30 AM)  Resp: 18 (11/25/2014 10:42 AM)  Temp: 36.5 C (97.7 F) (11/25/2014 10:30 AM)  SpO2: 98 % (11/25/2014 10:42 AM)  O2 Device: None (Room air) (11/25/2014 10:42 AM)   0-10 Scale: 7 (11/25/2014 10:42 AM)  Anesthesia type:  General  Complications Noted During Procedure or in PACU:  None   Comment:    Patient Location:  [facility]  Level of Consciousness:    Recovered to baseline  Patient Participation:     Able to participate  Temperature Status:    Normothermic  Oxygen Saturation:    Within patient's normal range  Cardiac Status:   Within patient's normal range  Fluid Status:    Stable  Airway Patency:     Yes  Pulmonary Status:    Baseline  Pain Management:    Adequate analgesia  Nausea and Vomiting:  None    Post Op Assessment:    Tolerated procedure well and no evidence of recall   Attending Attestation:  All indicated post anesthesia care provided     -

## 2014-11-25 NOTE — Op Note (Signed)
Operative Note (Surgical Log ID: [id number])       Date of Surgery: 11/25/2014       Surgeons: Surgeon(s) and Role:     * Judith Blonder, MD - Primary     * Gwendola Hornaday, Shelah Lewandowsky, MD - Resident - Assisting      * Pal, Ria CC3 - Assisting       Pre-op Diagnosis: Pre-Op Diagnosis Codes:     * Request for sterilization [Z78.9]       Post-op Diagnosis: Post-Op Diagnosis Codes:     * Request for sterilization [Z78.9]       Procedure(s) Performed: Procedure:    LAPAROSCOPIC BTL w/ filshie clips  CPT(R) Code:  95621 - PR LAP,TUBAL BLOCK BY DEVICE         Anesthesia Type: General        Fluid Totals: I/O this shift:  07/20 0700 - 07/20 1459  In: 1000 (8.6 mL/kg) [I.V.:1000]  Out: 105 (0.9 mL/kg) [Urine:100; Blood:5]  Net: 895  Weight: 115.7 kg        Estimated Blood Loss: Blood Loss: 5 mL       Patient Condition: good       Indications: 29 yo G1P1001 with undesired fertility requesting permanent surgical sterilization with bilateral tubal ligation. She was counseled on all her options, including LARC and partner vasectomy, understands that this is permanent and irreversible sterilization, and is certain she has completed childbearing.       Findings (Including unexpected complications): Normal-appearing uterus, bilateral fallopian tubes and ovaries. Fallopian tubes ligated with Filshie clips without difficulty, pictures taken to document results.     Description of Procedure:     The patient was taken to the operating room and placed on the operating room table. SCDs were placed and turned on. General anesthesia was administered without difficulty and the patient was placed in the dorsal lithotomy position. The perineum was prepped with betadine and the abdomen was prepped with chloraprep. The foley catheter was placed using sterile technique. A stop check was performed confirming the patient and the procedure. Two hand held retractors were placed in the patient's vagina, and the cervix was grasped at the anterior lip with the  single tooth tenaculum. The Reuben uterine manipulator was placed, and all other instruments were removed from the vagina. The patient was then sterily draped.     A 5 mm skin incision was made at the umbilicus after infiltration with 0.25% Marcaine. The 5 mm trocar was then placed through this incision using the visiport. Intraperitoneal insertion was confirmed visually, and the abdomen was insufflated with CO2 gas. Low opening pressure was noted. The pelvis was then examined with the findings as noted above. An 8 mm skin incision was made suprapubically after infiltration with 0.25% Marcaine. The 8 mm port was placed through this incision under direct video visualization.     A blunt probe was used to identify the left fallopian tube, which was traced to its fimbriated end. The Filshie device was advanced into the abdominal cavity, used to grasp the midportion of the left fallopian tube, and deployed after confirming that the full diameter of the tube had been grasped. Correct placement was confirmed visually. The exact same procedure was then performed on the right side. Pictures were taken to document results. Excellent hemostasis was noted.    The abdomen was totally desufflated and the ports were removed. The skin was re-approximated with 4-0 vicryl suture. The patient was cleansed and dried, sterile  dressings were applied.    All instruments were removed from the patient's vagina. The foley catheter was removed. The patient was reversed from anesthesia, extubated in the OR and taken to the PACU in stable condition.    Signed:  Samara SnideJennifer T Favor Kreh, MD  on 11/25/2014 at 9:50 AM

## 2014-11-25 NOTE — Discharge Instructions (Addendum)
DISCHARGE INSTRUCTIONS  Laparoscopic tubal ligation    11/25/2014     You have received sedative medication and/or general anesthesia which may make you drowsy for as long as 24 hours:     A)  DO NOT drive or operate any machinery for 24 hours     B)  DO NOT drink alcoholic beverages for 12 hours     C)  DO NOT make major decisions, sign contracts, etc for 24 hours  Please continue to adhere to these precautions if you are taking narcotic medication.    Discomfort after surgery:  - It is normal to have mild to moderate abdominal cramping or gas pains for a few days to a few weeks  - It is normal to have mild pain along your incision  - It is normal to have shoulder or neck pain (caused by gas in your abdomen)  - Ambulating or a heating pad set on low may help relieve gas pains  - Use acetaminophen (Tylenol) or ibuprofen for pain unless you are allergic to these drugs    Surgery site care after surgery  - It is normal to have some vaginal bleeding  - Keep incision site clean, it does not need to be covered    Activity after surgery  - You should rest the day of the procedure  - You can resume normal activities the day following your procedure  - Shower: You may shower  - Driving: you may drive after 24 hours  - Exercise: Do not resume vigorous exercise for 1 week  - Intercourse: Nothing in the vagina (no tampons, douching or intercourse) 2-4 weeks.    Diet after surgery  - You may feel nauseated from the surgery or anesthesia.  Advance diet as tolerated.    Medications  See medication reconciliation sheet    When to call your doctor:  - fever greater than 100 degrees F and/or chills  - Nausea and vomiting  - inability to urinate  - severe cramping or abdominal pain not relieved by acetaminophen (Tylenol) or ibuprofen  - foul smelling discharge  - heavy vaginal bleeding (soaking through 1 pad every 1-2 hours)  - With any other concerns or questions  - If you have an emergency and are unable to contact your doctor, you  may call the Emergency Department at [phone].  Do not take Ibuprofen before 3:30 pm
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
## 2014-11-26 ENCOUNTER — Encounter: Payer: Self-pay | Admitting: Obstetrics and Gynecology

## 2014-11-29 ENCOUNTER — Encounter: Payer: Self-pay | Admitting: Emergency Medicine

## 2014-11-29 ENCOUNTER — Emergency Department
Admission: EM | Admit: 2014-11-29 | Disposition: A | Discharge status: Home / Self Care | Payer: Self-pay | Source: Ambulatory Visit | Attending: Emergency Medicine | Admitting: Emergency Medicine

## 2014-11-29 ENCOUNTER — Telehealth: Payer: Self-pay | Admitting: Obstetrics & Gynecology

## 2014-11-29 LAB — CBC AND DIFFERENTIAL
Baso # K/uL: 0.1 10*3/uL (ref 0.0–0.1)
Basophil %: 0.5 %
Eos # K/uL: 0.5 10*3/uL — ABNORMAL HIGH (ref 0.0–0.4)
Eosinophil %: 5.4 %
Hematocrit: 39 % (ref 34–45)
Hemoglobin: 12.4 g/dL (ref 11.2–15.7)
IMM Granulocytes #: 0 10*3/uL (ref 0.0–0.1)
IMM Granulocytes: 0.2 %
Lymph # K/uL: 2.8 10*3/uL (ref 1.2–3.7)
Lymphocyte %: 28.9 %
MCH: 28 pg (ref 26–32)
MCHC: 32 g/dL (ref 32–36)
MCV: 86 fL (ref 79–95)
Mono # K/uL: 0.6 10*3/uL (ref 0.2–0.9)
Monocyte %: 5.9 %
Neut # K/uL: 5.7 10*3/uL (ref 1.6–6.1)
Nucl RBC # K/uL: 0 10*3/uL (ref 0.0–0.0)
Nucl RBC %: 0 /100 WBC (ref 0.0–0.2)
Platelets: 353 10*3/uL (ref 160–370)
RBC: 4.5 MIL/uL (ref 3.9–5.2)
RDW: 14.6 % — ABNORMAL HIGH (ref 11.7–14.4)
Seg Neut %: 59.1 %
WBC: 9.6 10*3/uL (ref 4.0–10.0)

## 2014-11-29 LAB — BASIC METABOLIC PANEL
Anion Gap: 13 (ref 7–16)
CO2: 27 mmol/L (ref 20–28)
Calcium: 9.5 mg/dL (ref 8.8–10.2)
Chloride: 102 mmol/L (ref 96–108)
Creatinine: 1 mg/dL — ABNORMAL HIGH (ref 0.51–0.95)
GFR,Black: 89 *
GFR,Caucasian: 77 *
Glucose: 91 mg/dL (ref 60–99)
Lab: 10 mg/dL (ref 6–20)
Potassium: 4.2 mmol/L (ref 3.3–5.1)
Sodium: 142 mmol/L (ref 133–145)

## 2014-11-29 LAB — HM HIV SCREENING OFFERED

## 2014-11-29 MED ORDER — IOHEXOL 350 MG/ML (OMNIPAQUE) IV SOLN *I*
1.0000 mL | Freq: Once | INTRAVENOUS | Status: AC
Start: 2014-11-29 — End: 2014-11-29
  Administered 2014-11-29: 70 mL via INTRAVENOUS

## 2014-11-29 MED ORDER — OXYCODONE-ACETAMINOPHEN 5-325 MG PO TABS *I*
1.0000 | ORAL_TABLET | Freq: Four times a day (QID) | ORAL | 0 refills | Status: DC | PRN
Start: 2014-11-29 — End: 2014-12-01

## 2014-11-29 MED ORDER — LORAZEPAM 2 MG/ML IJ SOLN *I*
INTRAMUSCULAR | Status: DC
Start: 2014-11-29 — End: 2014-11-29
  Filled 2014-11-29: qty 1

## 2014-11-29 MED ORDER — LORAZEPAM 2 MG/ML IJ SOLN *I*
1.0000 mg | Freq: Once | INTRAMUSCULAR | Status: AC
Start: 2014-11-29 — End: 2014-11-29
  Administered 2014-11-29: 1 mg via INTRAVENOUS

## 2014-11-29 NOTE — Discharge Instructions (Addendum)
Please review any accompanying literature given to you at discharge. These forms provide important information regarding your diagnosis.     Please take any prescriptions as prescribed until finished or otherwise instructed by your doctor.    Continue to take your previously prescribed medications as prescribed unless otherwise instructed to do so.     Should you experience any new or worsening symptoms please don't hesitate to return to the ED for further evaluation.          Discharge Instructions for Patients receiving   Contrast Medium    11/29/2014  3:58 AM    INFORMATION  I.V. contrast is eliminated through the kidneys.  Oral and rectal contrasts are not absorbed by the body and will be eliminated through the gastrointestinal tract.  Side effects and allergic reactions to contrast mediums are not common but can occur up to 48 hours after your scan.    INSTRUCTIONS   1. You will need to drink four 8 oz glasses of fluid over the next four hours, unless your medical condition does not allow you to do this.  Please speak with an [place] if you have any questions or concerns about this.  2. Mild headache may occur following contrast injection.  3. The kidneys excrete the contrast.  Your urine will NOT become discolored.  4. You may experience loose stools/diarrhea for approximately 24 hours after drinking oral contrast.  5. You may experience watery stool immediately after rectal contrast.  6.  If you are a DIABETIC and take Actos Plus Met, Actos Plus Met XR, Avandamet, Foramet, Glucophage, Glucophage XR, Glucovance, Glumetza, Janumet, Janumet XR, Jentadueto, Kazano, Kombiglyze XR, Lipan, PrandiMet, Paguate, [place] or Metformin, hold this medication for 48 hours (2 days) after your exam.  Check with your doctor for management of your diabetes during this time and before restarting these medications.  Your doctor will have to address the need for kidney function blood tests before restarting your  diabetes medication.    7.  There is no evidence that the contrast you have received would be harmful to your breastfed child.  If you choose, you may pump and discard your breast milk for the next 24 hours.    Delayed effects from contrast are not common.  However, notify your doctor IMMEDIATELY:    If nausea or vomiting occurs   If any itching, hives, wheezing, or rashes occur   Severe or throbbing headache    Monday - Friday 8am-5pm: For questions or concerns regarding your procedure please call [facility] ([facility] [phone].    After hours/Weekends:  [facility] patients may call [phone] and ask to speak with the Radiology resident on call.   [facility] patients may call [phone] to speak with the [place] or call [phone] and ask to speak with the radiology resident on call.  The Emergency Department is open 24 hours a day at [place] if emergency treatment is required.

## 2014-11-29 NOTE — ED Notes (Signed)
Pt returns from CT scan

## 2014-11-29 NOTE — ED Triage Notes (Signed)
Triage Note   Pt had tubal ligation on wed and yesterday started having discomfort to r back area and has pain with deep breathing. Describes discomfort as a clicking sound to r lower lung area.     Merideth Abbey, RN

## 2014-11-29 NOTE — ED Notes (Signed)
Pt asking when IV site can be removed and when results will come back, pt appears anxious to leave

## 2014-11-29 NOTE — ED Notes (Signed)
Medicated with ativan

## 2014-11-29 NOTE — Telephone Encounter (Signed)
Patient is a 29 y.o. G1P1001 s/p uncomplicated laparoscopic BTL on 11/25/14.  The patient called the emergency line complaining of pain with inspiration on the right.  She denies fever, chills, N/V, dysuria.  She reports her wounds are healing well.  She has had several BMs and does not think is due to gas pain.  No h/o blood clots.  The patient has many concerns during phone conversation.  Having her come to the [facility] ED for evaluation.  All questions answered.      Shelton Silvas MD  OBGYN R3  [phone]

## 2014-11-29 NOTE — ED Provider Progress Notes (Signed)
ED Provider Progress Note    Labs reviewed by me:    Labs Reviewed   CBC AND DIFFERENTIAL - Abnormal; Notable for the following:        Result Value    RDW 14.6 (*)     Eos # K/uL 0.5 (*)     All other components within normal limits   BASIC METABOLIC PANEL - Abnormal; Notable for the following:     Creatinine 1.00 (*)     All other components within normal limits       Imaging reviewed by me:    *Chest standard frontal and lateral views    (Results Pending)   CT angio chest    (Results Pending)     No central PE, peripheral vasculature visualization limited by movement.    Chest x-ray unremarkable.    Patient ambulated through ED without hypoxia.       Regina Gates Regina Conradi7/24/2016, 5:56 AM         Regina Conradi, MD  11/29/14 [phone]

## 2014-11-29 NOTE — ED Notes (Signed)
Pt very anxious asking that the iv that was just placed be removed. "i can't take it, I don't like it" pt tearful. Ct called to have ct expedited,.

## 2014-11-29 NOTE — ED Notes (Signed)
Pt resting in bed, pt holding hands with boyfriend present at bedside, pt asking if IV site can be removed, pt verbally reassured IV site must remain in place until pt is discharged, pt agreeable to this plan

## 2014-11-29 NOTE — ED Notes (Signed)
Pt discharged home, IV site removed, pt dressed, pt given discharge paperwork and follow up instructions, pt ambulates to door, pt taking medi cab home

## 2014-11-30 ENCOUNTER — Telehealth: Payer: Self-pay

## 2014-11-30 NOTE — Telephone Encounter (Signed)
Tc from [place]. She had a BTL last Wednesday, 11/25/14. She is having some difficulty/discomfort with inspiration on the right side "I feel like something is clicking". She went to the ED yesterday, no significant findings. Denies cough, fever, chills. She is supposed to return to work today at noon, does not feel ready.     Will forward to Dr. Marquis Lunch.

## 2014-11-30 NOTE — Telephone Encounter (Signed)
C/W Dr. Marquis Lunch, to check if the pt could come to the office tomorrow, she said that would be fine, as a PE was ruled out via CT scan in the ED. RTC to [place], appointment made for 8:15 am tomorrow. Belisa would like a letter for work, would like to get it tomorrow when she comes in.     Annaleah then asked about taking Gas X, she feels slightly bloated and constipated. Reviewed measures to help with the constipation and bloating.

## 2014-11-30 NOTE — Telephone Encounter (Signed)
I did see that she was see in the ED and had a negative CT to rule out PE. Is her pain the abdomen or the chest? We would be happy to see her in the office.     Thanks    [employer]

## 2014-11-30 NOTE — Telephone Encounter (Signed)
error 

## 2014-12-01 ENCOUNTER — Ambulatory Visit
Admission: RE | Admit: 2014-12-01 | Discharge: 2014-12-01 | Disposition: A | Discharge status: Home / Self Care | Payer: Self-pay | Source: Ambulatory Visit | Admitting: Obstetrics and Gynecology

## 2014-12-01 ENCOUNTER — Encounter: Payer: Self-pay | Admitting: Obstetrics and Gynecology

## 2014-12-01 ENCOUNTER — Ambulatory Visit: Payer: Self-pay | Admitting: Obstetrics and Gynecology

## 2014-12-01 VITALS — BP 116/80 | Ht 67.01 in | Wt 259.8 lb

## 2014-12-01 DIAGNOSIS — R109 Unspecified abdominal pain: Secondary | ICD-10-CM

## 2014-12-01 DIAGNOSIS — F17200 Nicotine dependence, unspecified, uncomplicated: Secondary | ICD-10-CM

## 2014-12-01 DIAGNOSIS — W57XXXA Bitten or stung by nonvenomous insect and other nonvenomous arthropods, initial encounter: Secondary | ICD-10-CM

## 2014-12-01 MED ORDER — IBUPROFEN 200 MG PO TABS *I*
600.0000 mg | ORAL_TABLET | Freq: Four times a day (QID) | ORAL | 0 refills | Status: AC | PRN
Start: 2014-12-01 — End: 2014-12-31

## 2014-12-01 MED ORDER — POLYETHYLENE GLYCOL 3350 POWD *I*
17.0000 g | Freq: Two times a day (BID) | 2 refills | Status: DC
Start: 2014-12-01 — End: 2015-07-14

## 2014-12-01 NOTE — Patient Instructions (Signed)
Quitting Smoking    The Basics   Written by the doctors and editors at [employer]   What are the benefits of quitting smoking? -- Quitting smoking can lower your chances of getting or dying from heart disease, lung disease, kidney failure, infection, or cancer. It can also lower your chances of getting osteoporosis, a condition that makes your bones weak. Plus, quitting smoking can help your skin look younger and reduce the chances that you will have problems with sex.  Quitting smoking will improve your health no matter how old you are, and no matter how long or how much you have smoked.   What should I do if I want to quit smoking? -- The letters in the word “START” can help you remember the steps to take:  S = Set a quit date.  T = Tell family, friends, and the people around you that you plan to quit.  A = Anticipate or plan ahead for the tough times you'll face while quitting.  R = Remove cigarettes and other tobacco products from your home, car, and work.  T = Talk to your doctor about getting help to quit.  How can my doctor or nurse help? -- Your doctor or nurse can give you advice on the best way to quit. He or she can also put you in touch with counselors or other people you can call for support. Plus, your doctor or nurse can give you medicines to:  ?Reduce your craving for cigarettes  ?Reduce the unpleasant symptoms that happen when you stop smoking (called “withdrawal symptoms”).  You can also get help from a free phone line (1-800-QUIT-NOW) or go online to www.smokefree.gov.   What are the symptoms of withdrawal? -- The symptoms include:  ?Trouble sleeping  ?Being irritable, anxious or restless  ?Getting frustrated or angry  ?Having trouble thinking clearly  Some people who stop smoking become temporarily depressed. Some of them need treatment for depression, such as counseling or antidepressant medicines. If you get depressed when you quit smoking, tell your doctor or nurse about it.   How do  medicines help? -- Different medicines work in different ways:  ?Nicotinereplacement therapy eases withdrawal and reduces your body’s craving for nicotine, the main drug found in cigarettes. Non-prescription forms of nicotine replacement include skin patches, lozenges, and gum. Prescription forms include nasal sprays and “puffers” or inhalers.  ?Bupropion is a prescription medicine that reduces your desire to smoke. This medicine is sold under the brand names Zyban and Wellbutrin. It is also available in a generic version, which is cheaper than brand-name medicines.   ?Varenicline (brand name: Chantix) is a prescription medicine that reduces withdrawal symptoms and cigarette cravings. If you think you’d like to take varenicline and you have a history of depression, anxiety, or heart disease, discuss this with your doctor or nurse before taking the medicine. Varenicline can also increase the effects of alcohol in some people. It’s a good idea to limit drinking while you’re taking it, at least until you know how it affects you.  If you take bupropion or varenicline and you have any of the following symptoms, stop taking the medicine and call your doctor or nurse:  ?Become very nervous  ?Become depressed  ?Start to do strange things   ?Think about killing yourself  How does counseling work? -- Counseling can happen during formal office visits or just over the phone. A counselor can help you:   ?Figure out what triggers your smoking and what to do instead  ?Overcome cravings  ?Figure out what went wrong   when you tried to quit before  What works best?--Studies show that people have the best luck at quitting if they take medicines to help them quit and work with a Veterinary surgeon. It might also be helpful to combine nicotine replacement with one of the prescription medicines that help people quit. In some cases, it might even make sense to take bupropion and varenicline together.   Will I gain weight if I quit?--Yes, you  might gain a few pounds. But quitting smoking will have a much more positive effect on your health than weighing a few pounds more. Plus, you can help prevent some weight gain by being more active and eating less. Taking the medicine bupropion might help control weight gain.   What else can I do to improve my chances of quitting?--You can:  ?Start exercising.  ?Stay away from smokers and places that you associate with smoking. If people close to you smoke, ask them to quit with you.   ?Keep gum, hard candy, or something to put in your mouth handy. If you get a craving for a cigarette, try one of these instead.  ?Dont give up, even if you start smoking again. It takes most people a few tries before they succeed.   What if I am pregnant and I smoke?--If you are pregnant, its really important for the health of your baby that you quit. Ask your doctor what options you have, and what is safest for your baby.   All topics are updated as new evidence becomes available and our peer review process is complete.  This topic retrieved from [employer] on: Aug 27, 2013.  Topic 15429 Version 13.0  Release: 23.3 - C23.74  2015[employer] Inc.All rights reserved.  Consumer Information Use and Disclaimer   This information is not specific medical advice and does not replace information you receive from your health care provider. This is only a brief summary of general information. It does NOT include all information about conditions, illnesses, injuries, tests, procedures, treatments, therapies, discharge instructions or life-style choices that may apply to you. You must talk with your health care provider for complete information about your health and treatment options. This information should not be used to decide whether or not to accept your health care provider's advice, instructions or recommendations. Only your health care provider has the knowledge and training to provide advice that is right for you.The use of Up[employer]Date  content is governed by the UpToDate Terms of Use. 2015 [employer]. All rights reserved.  Copyright   2015[employer], Inc.All rights reserved.    American Heart Association/American Stroke Association  Http://www.americanheart.org    How Can I Quit Smoking?    What if I go back to old habits?  Smoking cigarettes tops the list as the most important preventable major risk factor of our No. 1 killer -- heart and blood vessel disease. The long list of diseases and  deaths due to smoking is frightening. Smoking also harms thousands of nonsmokers, including infants and children, who are exposed to cigarette smoke.  If you smoke, you have good reason to worry about its effect on your health, your loved ones and others. You could become one of the more than 440,000 smoking-related deaths every year. When you quit, you reduce that risk tremendously!    Is it too late to quit?  No matter how much or how long youve smoked, when you quit smoking, your risk of heart disease and stroke starts to drop. In time your risk will  be about the same as if youd never smoked!    How do I quit?    Step One   List your reasons to quit and read them several times a day.   Wrap your cigarette pack with paper and rubber bands. Each time you smoke, write   down the time of day, how you feel, and how important that cigarette is to you on a scale of 1 to 5.   Rewrap the pack.    Step Two   Keep reading your list of reasons and add to it if you can.   Dont carry matches, and keep your cigarettes out of easy reach.   Each day, try to smoke fewer cigarettes, and try not to smoke the ones that arent most important.    Step Three   Continue with Step Two. Set a target date to quit.   Dont buy a new pack until you finish the one youre smoking.   Try to stop for 48 hours at one time.    Step Four   Quit smoking completely. Throw out all cigarettes and matches. Hide Engineer, maintenance.   Stay busy! Go to the movies, exercise,  take long walks, go bike riding.   Avoid situations and triggers you relate with smoking.   Find healthy substitutes for smoking. Carry sugarless gum or artificially sweetened   mints. Munch carrots or celery sticks. Try doing crafts or other things with your hands.   Do deep breathing exercises when you get the urge.    What if I smoke after quitting?   Its hard to stay a nonsmoker once youve had a cigarette, so do everything you can to avoid that one. The urge to smoke will pass. The first 2 to 5 minutes will be the toughest. If you do smokeafter quitting:   This doesnt mean youre a smoker again -- do something now to get back on track. Dont punish or blame yourself -- tell yourself youre still a nonsmoker.   Think about why you smoked and decide what to do differently the next time.   Sign a contract to stay a nonsmoker.    What happens after I quit?   Your senses of smell and taste come back.   Your smokers cough goes away.   Your digestive system returns to normal. You feel alive and full of energy.   You breathe much easier.   Its easier to climb stairs.   Youre free from the mess, smell and burns in clothing.   You feel free of needing cigarettes.   Youll live longer and have less chance of heart disease, stroke, lung disease   and cancer     How can I learn more?  1. Talk to your doctor, nurse or other health-care professionals. If you have heart disease or have had a stroke, members of your family also may be at higher risk. Its very important for them to make changes now to lower their risk.  2. Call 1-800-AHA-USA1 (435-363-0389) or visit americanheart.org to learn more about heart disease.  3. For information on stroke, call1-888-4-STROKE 219-821-3130) or visit [employer].nz.We have many other fact sheets and educational booklets to help you make healthier choices to reduce your risk, manage disease or care for a loved one.  Knowledge is power, so Learn and  Live!    How Can I Handle the Stress of Not Smoking?   No one says that quitting smoking is easy. But  everyone says its worth it! Quitting will  drastically reduce your risk of developing heart and blood vessel diseases -- diseases  that kill someone every 36 seconds. It will also lower your chance of having lung disease  and cancer. Most of all, quitting can save your life and the lives of nonsmokers around you.  No matter how much or how long youve smoked, when you quit your risk of heart disease goes down. In fact,only three years after quitting, your risk of heart disease  is almost the same as if youd never smoked!    How can I cope with the urge?  Write down the reasons why you quit and look  at the list often.   Dont talk yourself into smoking again. When you feel an urge to have just one, stop   yourself. Think of what triggered you, and find a different way to handle it. For example, if you feel nervous and think you need a cigarette, realize that you could take a walk to calm down instead.   Be prepared for times when youll get the urge. If you smoke when drinking, cut down   on alcohol so you dont weaken your promise to yourself.   Change your habits. Instead of having a cigarette after dinner, brush your teeth or   walk the dog.   Go where smoking isnt allowed. In restaurants ask to be seated in the nonsmoking section.   Stick around people who dont smoke. Ask for support and find a buddy you can call   when you feel weak. Tell others they can help you by not giving you a cigarette and by   being supportive.   Reward yourself each time you get through a day or week without smoking. Treat   yourself to a movie. Or figure out how much money youve saved and buy yourself   something special.    How can I relax?   Try deep breathing. Take a long, deep breath, count to 10 and release it. Repeat five times and youll feel much more relaxed.   Allow 20 minutes a day to let go of tension  this way: Close your eyes, relax your muscles and think hard about one word, like calm. Say it until you reach a state of relaxation.   Think positive thoughts! Focus on how great it is that youve stopped smoking, how   food tastes better and how nice it is not to wake up coughing. Remind yourself how   smoking stinks, stains your teeth and gives you bad breath.   Listen to relaxation audiotapes.    How can physical activity help?   Walking and other exercise releases stress and calms you.   It can improve your mood.   It keeps your mind off cigarettes.   It can help control your appetite.   It can help you lose weight if youre   overweight, or stay at a normal weight.   It can lower your blood pressure level.   It can increase your good blood lipid level.   It can help reduce your risk of developing heart disease and stroke.   It can help control blood sugar by improving how your body uses insulin.    How Can I Avoid Weight Gain When I Stop Smoking?  Quitting smoking doesnt mean youll automatically gain weight. And even if you do  gain a couple pounds, thats not as important as saving your life and the lives  of others. When people gain weight, its usually because they start to eat more once they quit smoking. If you watch what you eat and stay physically active, you may not gain at all!    What should I eat and drink?   Plenty of fruits and vegetables (at least 8-10 servings a day).   Whole-grain cereals, pastas and breads.   Fat-free or low-fat snacks like pretzels.   Sugar-free hard candy.   Foods low in saturated fat, trans fat and cholesterol. Read food labels and choose   healthful options.   Drink lots of water! Cut back on alcohol and drinks with caffeine (coffee, tea and soft drinks)    How can physical activity help?   Walking and other exercise releases stress and calms you.   It can help control your appetite.   It can improve your mood.   It burns calories and can  help you lose weight if you take in fewer calories than you use up.   It can help you stay at a healthy weight.    What are good activities to help keep weight off?  Becoming more active can help you reduce or maintain your weight. Try any of the following. Check with your doctor first if Myrtis Ser been inactive a long time or have medical problems,youre middle-aged or older, and you plan a vigorous exercise program.   Walk in your neighborhood or at indoor shopping malls.   Do gardening or yard work.   Take stairs instead of escalators and elevators.   Park farther from stores and walk.   Learn a new dance.   Ride a bicycle.   Try aerobic dance classes or use a videotape at home    What can I do instead of smoking?   Play with a pencil, paper clip or marbles.   Munch on carrots, apples, celery and sugarless gum.   Brush your teeth often and keep a fresh taste in your mouth.   Keep your hands busy -- wash the car, garden, knit, do crossword puzzles, write letters, cook.   Try a new sport.   Get plenty of rest and physical activity    What else can I do?   Try relaxation techniques like deep breathing or meditation.   Think positive thoughts! Feel proud about quitting.   Write down why youre quitting and read it.   Spend time with other nonsmokers.   Go where theres no smoking, like stores, movies, churches and libraries.   Reward yourself every day or week that you stay a nonsmoker. Dont use food as   a reward    Medicines To Help You Quit Smoking  Medicines can help you quit smoking when you use them correctly. Nicotine replacement medicines contain gradually decreasing doses of nicotine to help reduce the headaches and irritability you may have when you quit smoking. Non-nicotine prescription medicines can also help you quit by making nicotine cravings less severe.  Your doctor or nurse can help you decide if one of these medicines might help you. Your doctor may also decide that using both a  nicotine replacement medicine and a non- nicotine replacement medicine may work better for you.  When you talk to your doctor or nurse, ask how to use the medicine. Studies show that many people dont use their quit-smoking medicines correctly. If you dont use the medicine properly, it wont work well for you. The information sheet that comes with your medicine tells you exactly how  to use the medicine.    Nicotine Replacement Medicines  You cant use nicotine replacement medicines if you keep smoking or use other tobacco products. The combined use of nicotine can be dangerous. You must stop smoking completely when you begin using a nicotine replacement medicine.  Nicotine replacement treatment usually lasts from two to three months. Even though you can buy many products without a prescription, talk to your doctor first about which medicine is best for you.    Nicotine Chewing Gum or Lozenges  Nicotine gum has helped people quit smoking for 20 years. You can buy the gum or lozenges in a drug store without a prescription. Be sure to read the instructions and use the gum or lozenges correctly.   Try to chew a piece of gum or suck a lozenge every one to two hours that youre awake, but dont use more than 20 pieces per day if you use 4 mg gum or lozenges, or 30 pieces per day if you use 2 mg gum or lozenges. The number of pieces you use per day will decrease over time.   Dont drink coffee, orange juice, cola or alcohol for 15 minutes before or while using a piece of gum or lozenge. These drinks make the nicotine replacement less powerful.   If you dont use nicotine gum or lozenges correctly, you may have side effects such as mouth and throat discomfort.   You may need to use nicotine gum or lozenges for three months.    Nicotine Patch  You dont need a doctors prescription to buy the nicotine patch. The patches may come in different strengths: some brands are available in 5, 10 and 15 mg strengths; others may  come in 7, 14 and 21 mg strengths. Read the package to determine what strength you should start using, depending on the amount you smoke. Taper down to the lower- strength patches on the recommended schedule.   Wear the patch on your chest or high on your arm.   Put on a new patch every 16 or 24 hours. If you have trouble sleeping or have disturbing dreams, remove the patch when you go to bed and put on a new one as soon as you get up.   You may swim, shower and perform physical activity with the patch.   Side effects may include redness and soreness under the patch. To reduce these side effects, change the location of the patch each day.      Nicotine Spray  To buy the nicotine spray, you need a prescription from your doctor.   The spray goes in your nose, one or two times per hour, when youre awake.   The spray may cause coughing, runny nose or watery eyes during the first week or two. These side effects get better over time.   You may need to use nicotine spray for up to six months, but taper off starting at or before three months.    Nicotine Inhaler  The nicotine inhaler is a vapor (mist) that you breathe into your mouth and upper chest.   Youll use between six and 16 cartridges (tubes) each day.   When you first use it, the nicotine inhaler may cause mild throat or mouth discomfort.   You may need to use the inhaler for up to six months, but taper off starting at or before three months.    Non-Nicotine Prescription Medicines  Some of the major types of commonly prescribed smoking-cessation medicines are summarized  in this section. For your information and reference, we have included generic names as well as major trade names to help you identify what you may be taking; however, the AHA is not recommending or endorsing any specific products. If your prescription medication isn't on this list, remember that your healthcare provider and pharmacist are your best sources of information. It's important  to discuss all of the drugs you take with your doctor and understand their desired effects and possible side effects. Never stop taking a medication and never change your dose or frequency without first consulting your doctor.    Bupropion hydrochloride is a medicine for depression, but it also helps people quit smoking. Brand names include Zyban, Wellbutrin, Wellbutrin SR and Wellbutrin XL but this medication is also available as a generic.  Varenicline is a relatively new medicine that may help smokers quit. It is currently available under the brand name Chantix.   Both medicines work by blocking the flow of chemicals in the brain that make you want to smoke.   Both medicines come in pill form. You start out with a low dose and gradually increase up to the full dose.   It takes about a week for these medicines to work, so you need to start taking them before you quit smoking.   Each of these medicines may interact differently with other medicines you're taking. Make sure your doctor and pharmacist have a complete list of all your medicines, including over-the-counter drugs, supplements and herbal medicines.   You may need to use a non-nicotine prescription medicine for seven to 12 weeks or longer, as your doctor recommends.   When you get ready to stop taking a non-nicotine prescription medicine, you may need to take a gradually decreasing dose before you stop completely.  The FDA notified the public that the use of varenicline or bupropion has been associated with reports of behavior changes including hostility, agitation, depressed mood, and suicidal thoughts or actions. The FDA is requiring the manufacturers of these products to add a new Boxed Warning to the product labeling to alert healthcare professionals to this important new safety information.  While taking these drugs, if you experience any serious and unusual changes in mood or behavior or feel like hurting yourself or someone else, you  should stop taking the medicine and call you healthcare professional right away.  Friends or family members who notice these changes in behavior in someone who is taking varenicline or bupropion for smoking cessation should tell the person their concerns and recommend that he or she stop taking the drug and call a healthcare professional right away.

## 2014-12-01 NOTE — Progress Notes (Signed)
GYNECOLOGY OFFICE VISIT    HISTORY OF PRESENT ILLNESS  Regina Gates presents to clinic today for right mid axillary/flank that has been present since her surgery on the 7/20 for BTL. Went to the ED Sunday the 24th for this pain. Imaging (CT and xray) was negative. Has tried heating pack at home, has not helped. Pain has been a constant 5/10. No known renal pathology or abdominal pathology. Only past abdominal surgery was her BTL. No urinary symptoms. Has not had a bowel movement in three days - usually takes a stool softener which she has not been recently. Uses percocet twice a day for pain.     Also complains of left arm itching and swelling that she woke up with this morning. Is warm and painful to her. Says she was outside for some time yesterday and thinks some bug may have bit her.    In addition, Regina Gates mentions she is ready to quit smoking.      REVIEW OF SYSTEMS  Negative except as noted in HPI    Patient's medications, allergies, past medical, surgical, obstetrical, gynecologic, social and family histories were reviewed and updated as appropriate.  Problem list reviewed and updated.    MEDICATIONS  Prior to Admission medications    Medication Sig Start Date End Date Taking? Authorizing Provider   docusate sodium (COLACE) 100 MG capsule Take 1 capsule (100 mg total) by mouth 2 times daily 11/17/14  Yes Zannat, Ferdous, MD   oxyCODONE-acetaminophen (PERCOCET) 5-325 MG per tablet Take 1 tablet by mouth every 6 hours as needed for Pain   Max daily dose: 4 tablets 11/17/14  Yes Zannat, Ferdous, MD       ALLERGIES  No Known Allergies (drug, envir, food or latex)       PHYSICAL EXAM  Vitals:    12/01/14 0821   BP: 116/80   Weight: 117.8 kg (259 lb 12.8 oz)   Height: 1.702 m (5' 7.01")       General appearance - alert, well appearing, and in no distress  Mental status - alert, oriented to person, place, and time  Chest - CTAB  Heart - normal rate, regular rhythm, normal S1, S2, no murmurs, rubs, clicks or  gallops  Abdomen - soft, nontender, nondistended, no masses or organomegaly  Pelvic -  examination not indicated  Extremities - Left arm with two 6-8cm areas of erythema and swelling, one on upper arm and one on forearm. No pedal edema noted    IMAGING  Chest/upper abdomen CT and xray normal in ED on 7/24    ASSESSMENT/PLAN  29 y.o. G1P1001 African American woman with the following diagnosis(es):    1. Right flank pain  -BTL on 7/20  -incisions have healed  -complains of some right midaxillary/flank pain  -instructed to take miralax, given prescription, twice a day since she has not had a BM in 3-4 days  -instructed to stop using percocet for pain  -instructed to take motrin for pain, given prescription   2. Smoker  - greater than three minutes spent on counseling for smoking cessation  -patient would like to try help hotlines and gum or lozenges.   3. Bug bite without infection  -Antibiotics not indicated at this time  -instructed to follow up with primary care if her arm cellulitis worsens       Dispo:  She will follow-up with primary care for her arm cellulitis, if needed. Follow up with ob/gyn for her next annual appointment, sooner if  needed.     >30 min face to face time spent with the patient; >50% of this time was spent on counseling and coordination of care.
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
## 2014-12-10 ENCOUNTER — Ambulatory Visit
Admission: RE | Admit: 2014-12-10 | Discharge: 2014-12-10 | Disposition: A | Discharge status: Home / Self Care | Payer: Self-pay | Source: Ambulatory Visit | Admitting: Obstetrics and Gynecology

## 2014-12-10 ENCOUNTER — Encounter: Payer: Self-pay | Admitting: Obstetrics and Gynecology

## 2014-12-10 ENCOUNTER — Ambulatory Visit: Payer: Self-pay | Admitting: Obstetrics and Gynecology

## 2014-12-10 VITALS — BP 110/76 | Ht 67.01 in

## 2014-12-10 DIAGNOSIS — Z9889 Other specified postprocedural states: Secondary | ICD-10-CM

## 2014-12-10 NOTE — Progress Notes (Signed)
CC: Post op from BTL  HPI: This is a 29 y.o. G1P1001 who is now S/P BTL. Doing well. Had called regarding pain after procedure but that has resolved. No nausea or vomiting. No CP or SOB. NO pelvic or abdominal pain. No AUB. Flatus +. Voiding and ambulating with no difficulty.     Patient's medications, allergies, past medical, surgical, obstetrical, gynecologic, social and family histories were reviewed and updated as appropriate.  Problem list reviewed and updated.    Vitals:    12/10/14 1054   BP: 110/76   Height: 1.702 m (5' 7.01")     General: NAD  Abd: Soft, non-distended. Non-tender  Incision: C/D/I    A/P: This is a 29 y.o. G1P1001 s/p BTL. Patient doing well and happy with her decision.    Reviewed ectopic risks with patient.   Patient will return for annual visit.     Judith Blonder, MD  12/10/2014  11:20 AM

## 2015-01-19 ENCOUNTER — Ambulatory Visit: Payer: Self-pay | Admitting: Obstetrics and Gynecology

## 2015-01-19 ENCOUNTER — Encounter: Payer: Self-pay | Admitting: Obstetrics and Gynecology

## 2015-01-19 ENCOUNTER — Ambulatory Visit
Admission: RE | Admit: 2015-01-19 | Discharge: 2015-01-19 | Disposition: A | Discharge status: Home / Self Care | Payer: Self-pay | Source: Ambulatory Visit | Admitting: Obstetrics and Gynecology

## 2015-01-19 VITALS — BP 120/70 | Ht 67.0 in | Wt 255.0 lb

## 2015-01-19 DIAGNOSIS — N898 Other specified noninflammatory disorders of vagina: Secondary | ICD-10-CM

## 2015-01-19 NOTE — Progress Notes (Signed)
Subjective:       Regina Gates is a 29 y.o. female who was last seen in [facility] 12/10/14 for post-op BTL visits. She feels she has healed well.  She presents for evaluation of an abnormal vaginal discharge.    Vaginal symptoms: thin white VD without odor or itching.   She started rx PCN 4 x /day x 10 days for dental reasons on 01/13/15 and has been taking a daily Fluconazole during antibiotic course.   Contraception: tubal ligation.   She is not in a relationship and has sex sometimes with a friend. She reports condom use always.    Patient's medications, allergies, past medical, surgical, social and family histories were reviewed and updated as appropriate.    Review of Systems Pertinent items are noted in HPI.      Objective:        Visit Vitals    BP 120/70    Ht 1.702 m ( )    Wt 115.7 kg (255 lb)    LMP 12/22/2014    BMI 39.94 kg/m2     General appearance: alert, cooperative, no distress and morbidly obese  Abdomen: soft, NT, no HSM  Pelvic: EXT-no lesions/dc/odor. BUS-negative. VAG-small amount of thin white VD without odor, no lesions  POCT wet prep negative for whiff, few clue cells, no yeast or motile trich  Assessment:     Normal physiologic vaginal discharge  BCM: BTL 2016  Plan:      GC/CT/vaginitis tests and discussed safer sex; pt was given condoms  Plan rx per lab results; if BV pt would prefer rx Metrogel.  RV 09/2015 for annual gyn exam and prn  Donald Pore, CNM

## 2015-01-19 NOTE — Patient Instructions (Signed)
What is bacterial vaginosis?  Bacterial Vaginosis (BV) is the name of a condition in women where the normal balance of bacteria in the vagina is disrupted and replaced by an overgrowth of certain bacteria. It is sometimes accompanied by discharge, odor, pain, itching, or burning.    How common is bacterial vaginosis?  Bacterial Vaginosis (BV) is the most common vaginal infection in women of childbearing age. In the [place], BV is common in pregnant women.    How do people get bacterial vaginosis?  The cause of BV is not fully understood. BV is associated with an imbalance in the bacteria that are normally found in a woman’s vagina. The vagina normally contains mostly “good” bacteria, and fewer “harmful” bacteria. BV develops when there is an increase in harmful bacteria.   Not much is known about how women get BV. There are many unanswered questions about the role that harmful bacteria play in causing BV. Any woman can get BV. However, some activities or behaviors can upset the normal balance of bacteria in the vagina and put women at increased risk including:   • Having a new sex partner or multiple sex partners   • Douching  It is not clear what role sexual activity plays in the development of BV. Women do not get BV from toilet seats, bedding, swimming pools, or from touching objects around them. Women who have never had sexual intercourse may also be affected.    What are the signs and symptoms of bacterial vaginosis?  Women with BV may have an abnormal vaginal discharge with an unpleasant odor. Some women report a strong fish-like odor, especially after intercourse. Discharge, if present, is usually white or gray; it can be thin. Women with BV may also have burning during urination or itching around the outside of the vagina, or both. However, most women with BV report no signs or symptoms at all.    What are the complications of bacterial vaginosis?   In most cases, BV causes no complications. But there  are some serious risks from BV including:   • Having BV can increase a woman’s susceptibility to HIV infection if she is exposed to the HIV virus.  • Having BV increases the chances that an HIV-infected woman can pass HIV to her sex partner.  • Having BV has been associated with an increase in the development of an infection following surgical procedures such as a hysterectomy or an abortion.  • Having BV while pregnant may put a woman at increased risk for some complications of pregnancy, such as a preterm delivery.  • BV can increase a woman’s susceptibility to other STDs, such as herpes simplex virus (HSV), chlamydia and gonorrhea.    How does bacterial vaginosis affect a pregnant woman and her baby?   Pregnant women with BV more often have babies who are born premature or with low birth weight (low birth weight is less than 5.5 pounds).  The bacteria that cause BV can sometimes infect the uterus (womb) and fallopian tubes (tubes that carry eggs from the ovaries to the uterus). This type of infection is called pelvic inflammatory disease (PID). PID can cause infertility or damage the fallopian tubes enough to increase the future risk of ectopic pregnancy and infertility. Ectopic pregnancy is a life-threatening condition in which a fertilized egg grows outside the uterus, usually in a fallopian tube which can rupture.     How is bacterial vaginosis diagnosed?   A health care provider must examine the   vagina for signs of BV and perform laboratory tests on a sample of vaginal fluid to look for bacteria associated with BV.    What is the treatment for bacterial vaginosis?  Although BV will sometimes clear up without treatment, all women with symptoms of BV should be treated to avoid complications. Female partners generally do not need to be treated. However, BV may spread between female sex partners.  Treatment is especially important for pregnant women. All pregnant women who have ever had a premature delivery or low  birth weight baby should be considered for a BV examination, regardless of symptoms, and should be treated if they have BV. All pregnant women who have symptoms of BV should be checked and treated.  Some physicians recommend that all women undergoing a hysterectomy or abortion be treated for BV prior to the procedure, regardless of symptoms, to reduce their risk of developing an infection.  BV is treatable with antibiotics prescribed by a health care provider. Two different antibiotics are recommended as treatment for BV: metronidazole or clindamycin. Either can be used with non-pregnant or pregnant women, but the recommended dosages differ. Women with BV who are HIV-positive should receive the same treatment as those who are HIV-negative.  BV can recur after treatment.    How can bacterial vaginosis be prevented?   BV is not completely understood by scientists, and the best ways to prevent it are unknown. However, it is known that BV is associated with having a new sex partner or having multiple sex partners.  The following basic prevention steps can help reduce the risk of upsetting the natural balance of bacteria in the vagina and developing BV:  • Be abstinent.  • Limit the number of sex partners.  • Do not douche.  • Use all of the medicine prescribed for treatment of BV, even if the signs and symptoms go away.    FOR MORE INFORMATION:   Division of STD Prevention (DSTDP) Centers for Disease Control and Prevention  www.cdc.gov/std     CDC-INFO Contact Center 1-800-CDC-INFO (1-800-232-4636)   email: cdcinfo@cdc.gov     Website: www.cdc.gov     Reviewed 02/2010

## 2015-01-20 ENCOUNTER — Other Ambulatory Visit: Payer: Self-pay | Admitting: Obstetrics and Gynecology

## 2015-01-20 DIAGNOSIS — N760 Acute vaginitis: Secondary | ICD-10-CM

## 2015-01-20 DIAGNOSIS — B9689 Other specified bacterial agents as the cause of diseases classified elsewhere: Secondary | ICD-10-CM

## 2015-01-20 LAB — CHLAMYDIA PLASMID DNA AMPLIFICATION: Chlamydia Plasmid DNA Amplification: 0

## 2015-01-20 LAB — N. GONORRHOEAE DNA AMPLIFICATION: N. gonorrhoeae DNA Amplification: 0

## 2015-01-20 LAB — VAGINITIS SCREEN: DNA PROBE: Vaginitis Screen:DNA Probe: POSITIVE

## 2015-01-20 MED ORDER — METRONIDAZOLE 0.75 % VA GEL *I*
1.0000 | Freq: Every evening | VAGINAL | 0 refills | Status: AC
Start: 2015-01-20 — End: 2015-01-25

## 2015-01-28 DIAGNOSIS — F17200 Nicotine dependence, unspecified, uncomplicated: Secondary | ICD-10-CM | POA: Insufficient documentation

## 2015-02-25 ENCOUNTER — Telehealth: Payer: Self-pay

## 2015-02-25 ENCOUNTER — Other Ambulatory Visit: Payer: Self-pay | Admitting: Obstetrics and Gynecology

## 2015-02-25 MED ORDER — VALACYCLOVIR HCL 1000 MG PO TABS *I*
500.0000 mg | ORAL_TABLET | Freq: Two times a day (BID) | ORAL | 5 refills | Status: AC
Start: 2015-02-25 — End: 2015-02-28

## 2015-02-25 NOTE — Telephone Encounter (Signed)
02/25/2015 Phone call from patient requesting prescription for Valtrex. Currently with an outbreak.  Last annual 09/18/14. Will route request to provider pool. Marva PandaKeley J Cinda Hara, RN

## 2015-05-05 DIAGNOSIS — E559 Vitamin D deficiency, unspecified: Secondary | ICD-10-CM | POA: Insufficient documentation

## 2015-07-13 ENCOUNTER — Telehealth: Payer: Self-pay

## 2015-07-13 NOTE — Telephone Encounter (Signed)
Called patient and left message to call office. Need updated insurance information,if none please schedule an appointment with dana.

## 2015-07-14 ENCOUNTER — Ambulatory Visit: Payer: Self-pay | Admitting: Obstetrics and Gynecology

## 2015-07-14 ENCOUNTER — Ambulatory Visit
Admission: RE | Admit: 2015-07-14 | Discharge: 2015-07-14 | Disposition: A | Discharge status: Home / Self Care | Payer: Self-pay | Source: Ambulatory Visit | Admitting: Obstetrics and Gynecology

## 2015-07-14 ENCOUNTER — Ambulatory Visit: Payer: Self-pay

## 2015-07-14 ENCOUNTER — Encounter: Payer: Self-pay | Admitting: Obstetrics and Gynecology

## 2015-07-14 VITALS — BP 115/65 | Ht 67.0 in | Wt 242.0 lb

## 2015-07-14 DIAGNOSIS — Z113 Encounter for screening for infections with a predominantly sexual mode of transmission: Secondary | ICD-10-CM

## 2015-07-14 NOTE — Progress Notes (Signed)
GYN Visit:    CC: STI check    HPI: 30 y.o. 61P1001 female presenting to the office for a GYN visit for STI check and vaginal odor on and off.     ROS : A pertinent ROS done and negative except for what is noted in HPI    Medication list and allergies reviewed and updated in electronic record.     GYN Hx:   LMP: Patient's last menstrual period was 06/22/2015.  Menstrual Cycles: regular every 28-30 days   Hx STIs: chlamydia    Pap smear history:     -Date of most recent pap smear: 5/16 ; Result: neg    -Last annual 5/16    PE:   Vitals:    07/14/15 1059   BP: 115/65   Weight: 109.8 kg (242 lb)   Height: 1.702 m (5\' 7" )     General: healthy, alert and no distress  Exam otherwise deferred    Vaginitis and GC/CT self-collected    No results found for this or any previous visit (from the past 24 hour(s)).    A: 30 y.o. 681P1001 female presenting to the office for a GYN visit.   1. Routine screening for STI (sexually transmitted infection)  Vaginitis screen: DNA probe    Chlamydia plasmid DNA amplification    N. Gonorrhoeae DNA amplification    HIV 1&2 antigen/antibody    Syphilis Screen w Rfx to RPR and TPPA     P:  1. Await results to treat  2. Pap was not due   3. STI screening: GC, chlamydia, HIV, trichomonas, BV, yeast, syphilis  4. Contraception: tubal ligation   5. Problem list reviewed and updated.   6. Reviewed when & how to call, RTO in: 2 months for annual and prn    Bruna PotterAlexis Zayvien Canning, CNM

## 2015-07-14 NOTE — Addendum Note (Signed)
Addended by: Nedra HaiBRINSON, Dustan Hyams M on: 07/14/2015 11:18 AM     Modules accepted: Orders

## 2015-07-15 ENCOUNTER — Telehealth: Payer: Self-pay

## 2015-07-15 LAB — VAGINITIS SCREEN: DNA PROBE: Vaginitis Screen:DNA Probe: POSITIVE — AB

## 2015-07-15 LAB — N. GONORRHOEAE DNA AMPLIFICATION: N. gonorrhoeae DNA Amplification: 0

## 2015-07-15 LAB — CHLAMYDIA PLASMID DNA AMPLIFICATION: Chlamydia Plasmid DNA Amplification: 0

## 2015-07-15 MED ORDER — METRONIDAZOLE 500 MG PO TABS *I*
500.0000 mg | ORAL_TABLET | Freq: Two times a day (BID) | ORAL | 0 refills | Status: AC
Start: 2015-07-15 — End: 2015-07-22

## 2015-07-15 NOTE — Addendum Note (Signed)
Addended byBruna Potter: Yishai Rehfeld on: 07/15/2015 07:12 PM     Modules accepted: Orders

## 2015-07-15 NOTE — Telephone Encounter (Signed)
TC from [place]. She is looking for her lab results. Informed her that the cx are all still pending. Will leave in the pool, and check later.

## 2015-07-16 ENCOUNTER — Telehealth: Payer: Self-pay

## 2015-07-16 NOTE — Telephone Encounter (Signed)
TC to [place]. Informed her of the +BV, she reports that someone already called her, she is aware of the results and treatment.

## 2015-07-16 NOTE — Telephone Encounter (Signed)
07/16/2015 Phone call to patient advised her of vaginal culture results. Informed positive BV. Reviewed medication, including side effects and contraindications. Marva PandaKeley J Ad Guttman, RN

## 2015-07-16 NOTE — Telephone Encounter (Signed)
-----   Message from Bruna PotterAlexis Gee, CNM sent at 07/15/2015  7:13 PM EST -----  Pt has BV, I sent Flagyl to her pharmacy, please advise.

## 2015-07-21 ENCOUNTER — Ambulatory Visit: Payer: Self-pay | Admitting: Obstetrics and Gynecology

## 2015-09-29 ENCOUNTER — Ambulatory Visit: Payer: Self-pay | Attending: Obstetrics and Gynecology

## 2015-09-29 ENCOUNTER — Ambulatory Visit: Payer: Self-pay | Admitting: Obstetrics and Gynecology

## 2015-10-20 ENCOUNTER — Encounter: Payer: Self-pay | Admitting: Obstetrics and Gynecology

## 2015-10-20 ENCOUNTER — Other Ambulatory Visit
Admission: RE | Admit: 2015-10-20 | Discharge: 2015-10-20 | Disposition: A | Discharge status: Home / Self Care | Payer: Self-pay | Source: Ambulatory Visit | Attending: Obstetrics and Gynecology | Admitting: Obstetrics and Gynecology

## 2015-10-20 ENCOUNTER — Ambulatory Visit: Payer: MEDICAID | Attending: Obstetrics and Gynecology | Admitting: Obstetrics and Gynecology

## 2015-10-20 VITALS — BP 106/78 | Ht 67.0 in | Wt 243.0 lb

## 2015-10-20 DIAGNOSIS — Z0000 Encounter for general adult medical examination without abnormal findings: Secondary | ICD-10-CM | POA: Insufficient documentation

## 2015-10-20 DIAGNOSIS — K649 Unspecified hemorrhoids: Secondary | ICD-10-CM | POA: Insufficient documentation

## 2015-10-20 DIAGNOSIS — Z113 Encounter for screening for infections with a predominantly sexual mode of transmission: Secondary | ICD-10-CM

## 2015-10-20 DIAGNOSIS — Z01419 Encounter for gynecological examination (general) (routine) without abnormal findings: Secondary | ICD-10-CM

## 2015-10-20 DIAGNOSIS — K5900 Constipation, unspecified: Secondary | ICD-10-CM | POA: Insufficient documentation

## 2015-10-20 DIAGNOSIS — B009 Herpesviral infection, unspecified: Secondary | ICD-10-CM

## 2015-10-20 DIAGNOSIS — F17210 Nicotine dependence, cigarettes, uncomplicated: Secondary | ICD-10-CM | POA: Insufficient documentation

## 2015-10-20 LAB — VAGINITIS SCREEN: DNA PROBE: Vaginitis Screen:DNA Probe: POSITIVE — AB

## 2015-10-20 MED ORDER — DOCUSATE SODIUM 100 MG PO CAPS *I*
100.0000 mg | ORAL_CAPSULE | Freq: Two times a day (BID) | ORAL | 0 refills | Status: AC | PRN
Start: 2015-10-20 — End: ?

## 2015-10-20 MED ORDER — NICOTINE POLACRILEX 2 MG MT GUM *I*
2.0000 mg | CHEWING_GUM | OROMUCOSAL | 0 refills | Status: AC | PRN
Start: 2015-10-20 — End: ?

## 2015-10-20 MED ORDER — VALACYCLOVIR HCL 1000 MG PO TABS *I*
1000.0000 mg | ORAL_TABLET | Freq: Every day | ORAL | 0 refills | Status: DC
Start: 2015-10-20 — End: 2015-12-22

## 2015-10-20 MED ORDER — HYDROCORTISONE ACE-PRAMOXINE 2.5-1 % RE CREA *I*
TOPICAL_CREAM | Freq: Three times a day (TID) | CUTANEOUS | 0 refills | Status: AC | PRN
Start: 2015-10-20 — End: ?

## 2015-10-20 NOTE — Patient Instructions (Addendum)
Healthy Practices for Women of all Ages     In addition to your regular OB/GYN history, physical, cholesterol screening, Pap smear, and other recommended cancer screening tests, we ask that you review this list of healthy practices.  Modifying your daily activities according to these practices may improve your overall health and well-being.  In the event of questions about this list, ask your doctor or health care provider.  Additionally, there are specially written pamphlets available, which offer further information.    Diet and Exercise:  - Limit fat and cholesterol; emphasize fruits, grains and vegetables.  - Consume dairy products or use calcium supplementation for adequate calcium intake (1200 mg or more).  - Add folic acid supplementation 0.4 mg (400 micrograms, present in most daily multivitamins) at least two months before considering pregnancy to reduce the risks of birth defects.  - Participate in regular exercise for 30 minutes at least five times a week, and consider weight training.    Injury Prevention:  - Seat/lap belts should be worn while in a moving car.  - Helmet should be used when using motorcycles, bicycles, roller blades and ATVs or skiing.  - Place approved smoke detectors in your house and replace the batteries twice a year.    - Guns and other firearms should be stored unloaded and in a locked area.  Trigger locks should be used as well.  - Consider CPR training for household members.    Dental Health:  - Schedule regular visits to the dentist.  - Floss and brush with fluoride toothpaste daily.    Immunizations:  - A tetanus/diphtheria booster shot (d/T) is recommended every 10 years.  - An MMR vaccine is recommended for non-pregnant women born after 42 without proof of immunity of documentation of previous immunization.  Adults who are susceptible to varicella (chicken pox) should be vaccinated.  - Influenza vaccine is indicated yearly for women age 71 and older, or at any age based  on medical history and exposure.  Pregnant women over [redacted] weeks gestation should receive the flu vaccine as well.  - Pneumococcal pneumonia vaccine is indicated for age 60 and older once in your life.  - Hepatitis A and/or B vaccines are recommended for high-risk individuals.    Substance Abuse:  - Stop smoking; do not use any other tobacco products.  - Avoid alcohol use when driving, boating, swimming or operating other machinery. Avoid excessive use of alcoholic beverages.  - Recreational drug use (marijuana, cocaine, etc.) is dangerous and can be habit-forming.    Sexual Behavior:  - Be sure to use contraception if pregnancy is not desired.  - Regular use of female or female condoms with spermicide helps prevent STDs.  - Consider HIV testing if:  1. You have had more than one sexual partner.  2. You have had any STDs.  3. You have used intravenous drugs.  4. You have a sexual partner with these (the above) risk factors.  5. Your sexual partner has had female homosexual exposure.  6. You received a blood transfusion during 1978-1985.    Breast Health:  - Breast self-examinations should be done monthly (after your period).  - A mammogram should be done once between ages 68-40, then every 1-2 years based on risk factors.  If there is a strong family history of premenopausal breast cancer, you should start with mammograms earlier than age 9.    Colon Cancer Surveillance:  - Beginning at age 6, stool occult blood screening should  be done annually and/or sigmoidoscopy (scope examination of the colon) every 3-5 years.    Women and Alcohol  For women, alcohol use carries some unique and special concerns. Most studies on alcohol have been on men, but we now know women may respond differently to alcohol.   Concerns: Liver damage, cancer, menstrual cycle changes, birth defects, fetal alcohol syndrome, intoxication, nutrition and weight management.  All women should avoid all alcohol during pregnancy.  Help for Problem  Drinking:   One in every three members of Alcoholics Anonymous (AA) is now a woman.  AA meetings are held regularly and have led the way in demonstrating the effectiveness of self-help.   Outpatient and Inpatient Treatment are also available.  Ask your health care provider for a referral.   The first step in successful treatment is to recognize that there is a problem and accept help.    Dietary and Supplemental Calcium  It is important that you build and protect your bone mass through a program of regular exercise and proper nutrition with adequate amounts of calcium in your diet. Dairy products are considered to be the most important sources of calcium.    Reviewed 02/2010    Calcium     It is important that you build and protect your bone mass through a program of regular exercise and proper nutrition with adequate amounts of calcium in your diet.  Dairy products are considered to be the most important sources of calcium.  However, several other categories of food contain items high in calcium. Using the following list, as well as reading labels carefully, will enable you to improve the calcium content in your diet.  (Note: sources with lower fat content provide the same amount of calcium white sparing you unnecessary fat and calories; i.e., skim milk is just as good as whole milk).  *See DIETARY SOURCES OF CALCIUM below.     Tips to Increase the Calcium Content of Your Diet:   Add cheese chunks, shredded cheese, or grated cheese to salads, vegetables, soups and sandwiches.   Use milk (preferably non-fat milk) in cooking whenever possible.   Add powdered non-fat dry milk to hot beverages and soups and to recipes with out adding many calories.   Prepare soup stock with the bones of meat and add several teaspoons of vinegar to draw the calcium out of the bones.   Try tofu (soybean curd) with vegetables, fish, or meat.    For Those Who Are Lactose Intolerant:   Acidophilus milk or lactose-reduced milk  provides a good alternative to skim or low-fat milk while delivering a comparable amount of calcium.   The process of making yogurt and cheese breaks the lactose molecule, thereby enabling lactose-intolerant individuals to eat these dairy products.   Over-the-counter product such as Dairy-Ease and Lactaid replace the lactose enzyme which lactose -intolerant individuals lack, enabling them to consume milk and milk-containing foods.     Calcium Supplements:  While experts strongly recommend a calcium-rich diet as the preferred source of this mineral, calcium supplements are available (NOTE: Only a limited amount of calcium is absorbed). Calcium supplements are best absorbed in the presence of plenty of stomach acid. Therefore, it is best to take them with food.  SUPPLEMENT CALCIUM  (per tablet mg) % Calcium Available Calcium Available   CALCIUM CARBONATE:      Caltrate Plus 600 40% 240   Oscal 1250 40% 500   Alka Mints 850 40% 340   Rolaids (Calcium rich) 550  40% 220   Tums 500 1250 40% 500   CALCIUM LACTATE 650 13% 85   CALCIUM GLUCONATE 650 9% 59   CALCIUM CITRATE   (Citracal) 315 8% 25         Dietary Sources of Calcium  FOOD SERVING SIZE CALCIUM (mg) CALORIES FAT (mgs)   DAIRY       Cheddar cheese 1 oz. 204 115 9   Mozzarella cheese 1 oz. 207 80 5   [place] (2%) 1 cup 155 200 4   Milk   - Whole 1 cup 291 150 8           - 2% 1 cup 297 120 5           - Skim 1 cup 302 80 0   Yogurt, plain, low fat 1 cup 415 145 4   Cheese pizza (1/8 of 15 in pie) 1 slice 308 657 9   Macaroni and cheese 1 cup 200 230 10   Ice cream, vanilla 1 cup 176 255 14   Ice milk, vanilla 1 cup 176 200 7   Frozen yogurt, vanilla 1 cup 160 220 3   Sherbet 1 cup 240 60 3   FISH       Salmon, pink w/ bones 3 oz. 167 120 4.6   Sardines (in oil, w/ bones) 3 oz. 370 175 9   Shrimp (canned) 3 oz. 98 100 1   FRUITS AND VEGETABLES       Orange juice (calcium fortified) 8 oz. 300 110 0   Strawberries 16 berries 64 200 0   Broccoli, raw/cooked 1  spear 72 40 0   Collards 1 cup 357 30 0     Kale (cooked) 1 cup 179 42 0   Peas 1 cup 32 140 0   OTHER       Almonds  cup 75 210 17   Hot cocoa 6 oz. 90 100 1   Tofu (1 piece)  (1X2 X1in) 100 85 5   Tomato soup 1 cup 160 160 6   Pork and beans 1 cup 140 310 7     Calcium Intake Recommendations    Adolescents and young adults (11-24 years) 1200-1500 mg/day   Women (25-50 years) 1000 mg/day   Postmenopausal women ?65 years on estrogen 1000 mg/day   Postmenopausal women ?65 years not on estrogen 1500 mg/day   All women over 60 years of age 82 mg/day   Pregnant or lactating women Add 400 mg to based recommendation     Reviewed 02/2010  Genital Herpes   Take rx Valtrex 1000 mg daily for 5 days if you have an outbreak  What is genital herpes?  Genital herpes is a sexually transmitted disease (STD) caused by the herpes simplex viruses type 1 (HSV-1) or type 2 (HSV-2). Most genital herpes is caused by HSV-2. Most individuals have no or only minimal signs or symptoms from HSV-1 or HSV-2 infection. When signs do occur, they typically appear as one or more blisters on or around the genitals or rectum. The blisters break, leaving tender ulcers (sores) that may take two to four weeks to heal the first time they occur. Typically, another outbreak can appear weeks or months after the first, but it almost always is less severe and shorter than the first outbreak. Although the infection can stay in the body indefinitely, the number of outbreaks tends to decrease over a period of years.     What is  genital herpes?  Genital herpes is a sexually transmitted disease (STD) caused by the herpes simplex viruses type 1 (HSV-1) or type 2 (HSV-2). Most genital herpes is caused by HSV-2. Most individuals have no or only minimal signs or symptoms from HSV-1 or HSV-2 infection. When signs do occur, they typically appear as one or more blisters on or around the genitals or rectum. The blisters break, leaving tender ulcers (sores) that may  take two to four weeks to heal the first time they occur. Typically, another outbreak can appear weeks or months after the first, but it almost always is less severe and shorter than the first outbreak. Although the infection can stay in the body indefinitely, the number of outbreaks tends to decrease over a period of years.     How common is genital herpes?  Results of a nationally representative study show that genital herpes infection is common in the [place]. Nationwide, at least 54 million people ages 15 and older, or one out of five adolescents and adults, have had genital HSV infection. Over the past decade, the percent of Americans with genital herpes infection in the [place] has decreased.  Genital HSV-2 infection is more common in women (approximately one out of four women) than in men (almost one out of eight). This may be due to female-to-female transmission being more likely than female-to-female transmission.    g How do people get genital herpes?  HSV-1 and HSV-2 can be found in and released from the sores that the viruses cause, but they also are released between outbreaks from skin that does not appear to have a sore. Generally, a person can only get HSV-2 infection during sexual contact with someone who has a genital HSV-2 infection. Transmission can occur from an infected partner who does not have a visible sore and may not know that he or she is infected.  HSV-1 can cause genital herpes, but it more commonly causes infections of the mouth and lips, so-called fever blisters. HSV-1 infection of the genitals can be caused by oral-genital or genital-genital contact with a person who has HSV-1 infection. Genital HSV-1 outbreaks recur less regularly than genital HSV-2 outbreaks.  What are the signs and symptoms of genital herpes?   Most people infected with HSV-2 are not aware of their infection. However, if signs and symptoms occur during the first outbreak, they can be quite pronounced. The first  outbreak usually occurs within two weeks after the virus is transmitted, and the sores typically heal within two to four weeks. Other signs and symptoms during the primary episode may include a second crop of sores, and flu-like symptoms, including fever and swollen glands. However, most individuals with HSV-2 infection never have sores, or they have very mild signs that they do not even notice or that they mistake for insect bites or another skin condition. People diagnosed with a first episode of genital herpes can expect to have several (typically four or five) outbreaks (symptomatic recurrences) within a year. Over time these recurrences usually decrease in frequency. It is possible that a person becomes aware of the first episode years after the infection is acquired.    What are the complications of genital herpes?   Genital herpes can cause recurrent painful genital sores in many adults, and herpes infection can be severe in people with suppressed immune systems. Regardless of severity of symptoms, genital herpes frequently causes psychological distress in people who know they are infected.  In addition, genital HSV can lead to  potentially fatal infections in babies. It is important that women avoid contracting herpes during pregnancy because a newly acquired infection during late pregnancy poses a greater risk of transmission to the baby. If a woman has active genital herpes at delivery, a cesarean delivery is usually performed. Fortunately, infection of a baby from a woman with herpes infection is rare.  Herpes may play a role in the spread of HIV, the virus that causes AIDS. Herpes can make people more susceptible to HIV infection, and it can make HIV-infected individuals more infectious.    How is genital herpes diagnosed?  The signs and symptoms associated with HSV-2 can vary greatly. Health care providers can diagnose genital herpes by visual inspection if the outbreak is typical, and by taking a  sample from the sore(s) and testing it in a laboratory. HSV infections can be diagnosed between outbreaks by the use of a blood test. Blood tests, which detect antibodies to HSV-1 or HSV-2 infection, can be helpful, although the results are not always clear-cut.    Is there a treatment for herpes?  There is no treatment that can cure herpes, but antiviral medications can shorten and prevent outbreaks during the period of time the person takes the medication. In addition, daily suppressive therapy for symptomatic herpes can reduce transmission to partners.    How can herpes be prevented?  The surest way to avoid transmission of sexually transmitted diseases, including genital herpes, is to abstain from sexual contact, or to be in a long-term mutually monogamous relationship with a partner who has been tested and is known to be uninfected.  Genital ulcer diseases can occur in both female and female genital areas that are covered or protected by a latex condom, as well as in areas that are not covered. Correct and consistent use of latex condoms can reduce the risk of genital herpes.  Persons with herpes should abstain from sexual activity with uninfected partners when lesions or other symptoms of herpes are present. It is important to know that even if a person does not have any symptoms he or she can still infect sex partners. Sex partners of infected persons should be advised that they may become infected and they should use condoms to reduce the risk. Sex partners can seek testing to determine if they are infected with HSV. A positive HSV-2 blood test most likely indicates a genital herpes infection.     FOR MORE INFORMATION:   Centers for Disease Control and Prevention  [employer]  1-800-CDC-INFO (8-469-629-5284)      Reviewed 02/2010  Constipation    General Information:    What is constipation?Constipation (kon-sti-PAY-shun) is a condition that happens when your BM (bowel movement) is hard and dry, or  when you go a longer time than usual without having a BM. For some people it is normal to have two or three BMs daily. For others, it is normal to have a BM every three to five days. It may be painful and hard for you to push out the BM. You may also feel you need to have a BM, but cannot. Constipation is a problem for many people.    What causes constipation?    You may not be eating enough high fiber foods (roughage), such as fruits, vegetables, bran, or whole-grain cereals.   You may not be drinking enough water or other liquids each day.   You may not be exercising enough.   You may be taking a certain medicine or have an  illness, such as depression, that could cause constipation.   You may have weakened muscles or a loss of feeling of the anus (where BMs come out of).     What are the signs and symptoms of constipation? You may have a very hard time pushing out your BM. Other signs may be pain or bleeding during or after a BM. You may feel like you did not finish having your BM. You may also have nausea (upset stomach), feel full, or not feel hungry. You may also have a headache.    What can be done to treat and prevent constipation?   Constipation is usually improved by drinking more water or other liquids. You should drink at least six to eight (8-ounce) cups of water each day, unless your caregiver tells you not to. Other healthy liquids should be drank in addition to this amount. Certain juices, such as prune juice may also decrease constipation.     A high fiber diet usually helps decrease constipation. It can also help decrease a high blood cholesterol level, and help manage diabetes. This diet contains foods that have a lot of fiber. Fiber is the part of fruits, vegetables, and grains that is not broken down by your body. A high fiber diet will add bulk and softness to your BMs. Your diet should include fresh fruits and vegetables, whole grain bread products, cereals with fiber, and beans. Fiber  should be added into your diet slowly over time. Ask your caregiver for more information about a diet high in fiber.    Exercising three times or more each week can also help. Walking fast, jogging, swimming, and riding a bicycle are all good exercises to do. When you exercise, you will also need to increase how much fluid you drink.     You may need to take fiber supplements (pills). Ask your caregiver which fiber supplement is best for you. These pills may be bought at a grocery or drug store.     Laxative medicine may be suggested by your caregiver. This medicine may make it easier to push out a BM.     You may also need medicine to soften your BMs, called stool softeners. Ask your caregiver before taking any medicine used to treat constipation.     If you have loss of feeling or other problems with your body, your caregiver may want you to have other tests and treatments.       Reviewed 02/2010  Frequently Asked Questions about Quitting Smoking     Are you or someone you know trying to quit smoking? If so, the following information may help you.     Question: Why should I quit?    Answer: You will live longer and feel better. Quitting will lower your chances of having a heart attack, stroke, or cancer. The people you live with, especially children, will be healthier. If you are pregnant, you will improve your chances of having a healthy baby. And you will have extra money to spend on things other than cigarettes.    Question: What is the first thing I need to do once Ive decided to quit?    Answer: You should set a quit date-the day when you will break free of your tobacco addiction. Then, consider visiting your doctor or other health care provider before the quit date. She or he can help by providing practical advice and information on the medication that is best for you.    Question: What medication  would work best for me?    Answer: Different people do better with different methods. You have five choices of  medications that are currently approved by the [place] Food and Drug Administration:    A non-nicotine pill (bupropion SR).    Nicotine gum.    A nicotine inhaler.    A nicotine nasal spray.    Nicotine patch.  The gum and patches are available at your local pharmacy, or you can ask your health care provider to write you a prescription for one of the other medications. The good news is that all five medications have been shown to be effective in helping smokers who are motivated to quit.    Question: How will I feel when I quit smoking? Will I gain weight?    Answer: Many smokers gain weight when they quit, but it is usually less than 10 pounds. Eat a healthy diet, stay active, and try not to let weight gain distract you from your main goal--quitting smoking. Some of the medications to help you quit may help delay weight gain.    Question: Some of my friends and family are smokers. What should I do when Im with them?    Answer: Tell them that you are quitting, and ask them to assist you in this effort. Specifically, ask them not to smoke or leave cigarettes around you.  Question: What kinds of activities can I do when I feel the urge to smoke?    Answer: Talk with someone, go for a walk, drink water, or get busy with a task. Reduce your stress by taking a hot bath, exercising, or reading a book.    Question: How can I change my daily routine, which includes smoking a cigarette with my breakfast?    Answer: When you first try to quit, change your routine. Eat breakfast in a different place, and drink tea instead of coffee. Take a different route to work.    Question: I like to smoke when I have a drink. Do I have to give up both?  Answer: Its best to avoid drinking alcohol for the first 3 months after quitting because drinking lowers your chances of success at quitting. It helps to drink a lot of water and other nonalcoholic drinks when you are trying to quit.    Question: Donnald Garre tried to quit before and it didnt  work. What can I do?    Answer: Remember that most people have to try to quit at least 2 or 3 times before they are successful. Review your past attempts to quit. Think about what worked--and what didnt--and try to use your most successful strategies again.    Question: What should I do if I need more help?    Answer: Get individual, group, or telephone counseling. The more counseling you get, the better your chances are of quitting for good. Programs are given at [employer] and health centers. Call your local health department for information about programs in your area. Also, talk with your doctor or other health care provider.    To get a free print copy of the consumer brochure, You Can Quit Smoking, call any of the following toll-free numbers:   [employer] for [place] and [employer] ([employer]Q)  [place] for Disease Control and Prevention Librarian, academic)  [place] ([place])  800-4-CANCER  More information on quitting is available online at the [employer] site ([employer])  Reviewed 02/2010

## 2015-10-21 LAB — HEPATITIS C ANTIBODY: Hep C Ab: NEGATIVE

## 2015-10-21 LAB — HIV 1&2 ANTIGEN/ANTIBODY: HIV 1&2 ANTIGEN/ANTIBODY: NONREACTIVE

## 2015-10-21 LAB — SYPHILIS SCREEN
Syphilis Screen: NEGATIVE
Syphilis Status: NONREACTIVE

## 2015-10-22 ENCOUNTER — Telehealth: Payer: Self-pay | Admitting: Obstetrics and Gynecology

## 2015-10-22 DIAGNOSIS — Z0000 Encounter for general adult medical examination without abnormal findings: Secondary | ICD-10-CM

## 2015-10-22 LAB — GYN CYTOLOGY

## 2015-10-22 LAB — N. GONORRHOEAE DNA AMPLIFICATION: N. Gonorrhoeae DNA Amplification: 0

## 2015-10-22 LAB — CHLAMYDIA PLASMID DNA AMPLIFICATION: Chlamydia plasmid DNA AMplification: 0

## 2015-10-22 NOTE — Telephone Encounter (Signed)
Unable to reach patient by phone or leave VM re 10/20/15 vaginitis screen positive for gardnerella; will f/u Mon 10/25/15 Donald PoreJo Yoshika Vensel, CNM

## 2015-10-26 NOTE — Telephone Encounter (Signed)
T/C from patient. Advised of BV. Patient denies symptoms and does not want to take medication. Patient informed pap smear and other labs all negative.

## 2015-10-29 ENCOUNTER — Encounter: Payer: Self-pay | Admitting: Obstetrics and Gynecology
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
## 2015-12-22 ENCOUNTER — Other Ambulatory Visit: Payer: Self-pay

## 2015-12-22 DIAGNOSIS — N898 Other specified noninflammatory disorders of vagina: Secondary | ICD-10-CM

## 2015-12-22 DIAGNOSIS — B009 Herpesviral infection, unspecified: Secondary | ICD-10-CM

## 2015-12-22 MED ORDER — VALACYCLOVIR HCL 1000 MG PO TABS *I*
1000.0000 mg | ORAL_TABLET | Freq: Every day | ORAL | 0 refills | Status: AC
Start: 2015-12-22 — End: ?

## 2015-12-22 MED ORDER — METRONIDAZOLE 500 MG PO TABS *I*
500.0000 mg | ORAL_TABLET | Freq: Two times a day (BID) | ORAL | 0 refills | Status: AC
Start: 2015-12-22 — End: 2015-12-29

## 2015-12-22 NOTE — Telephone Encounter (Signed)
Error noted in message below; should read rx Valtrex 1000 mg po daily.

## 2015-12-22 NOTE — Telephone Encounter (Signed)
[place] but she is wondering if she can now be treated for the BV that she was diagnosed with in June, as she still has an odor and discharge. She would like po Flagyl. She is also requesting a 30 day supply of Valtrex, as she takes it daily. She thinks this will cover her until she finds a new provider. Will forward to Donald PoreJo Wrona at the pt's request.

## 2015-12-22 NOTE — Telephone Encounter (Signed)
Morrie Sheldonshley was last seen at her annual gyn visit on 10/20/15. She now [place] in another state & requests rx HSV daily suppression and for presumptiive BV.   One time order for  rx Valtrex 100 mg po daily #30, no refills. Also rx Metronidazole 500 mg po twice a day for 7 days.  Will not continue any more long distance prescriptions.  Donald PoreJo Zachery Niswander, CNM

## 2017-07-18 ENCOUNTER — Other Ambulatory Visit (HOSPITAL_COMMUNITY)
Admission: RE | Admit: 2017-07-18 | Discharge: 2017-07-18 | Disposition: A | Discharge status: Home / Self Care | Payer: 59 | Source: Ambulatory Visit | Attending: Obstetrics and Gynecology | Admitting: Obstetrics and Gynecology

## 2017-07-18 ENCOUNTER — Other Ambulatory Visit: Payer: Self-pay

## 2017-07-18 ENCOUNTER — Encounter: Payer: Self-pay | Admitting: Obstetrics and Gynecology

## 2017-07-18 ENCOUNTER — Ambulatory Visit (INDEPENDENT_AMBULATORY_CARE_PROVIDER_SITE_OTHER): Payer: 59 | Admitting: Obstetrics and Gynecology

## 2017-07-18 VITALS — BP 92/54 | HR 82 | Resp 16 | Ht 66.5 in | Wt 244.0 lb

## 2017-07-18 DIAGNOSIS — R012 Other cardiac sounds: Secondary | ICD-10-CM | POA: Diagnosis not present

## 2017-07-18 DIAGNOSIS — Z124 Encounter for screening for malignant neoplasm of cervix: Secondary | ICD-10-CM | POA: Insufficient documentation

## 2017-07-18 DIAGNOSIS — Z6838 Body mass index (BMI) 38.0-38.9, adult: Secondary | ICD-10-CM

## 2017-07-18 DIAGNOSIS — Z23 Encounter for immunization: Secondary | ICD-10-CM | POA: Diagnosis not present

## 2017-07-18 DIAGNOSIS — R011 Cardiac murmur, unspecified: Secondary | ICD-10-CM

## 2017-07-18 DIAGNOSIS — N898 Other specified noninflammatory disorders of vagina: Secondary | ICD-10-CM | POA: Diagnosis not present

## 2017-07-18 DIAGNOSIS — Z01419 Encounter for gynecological examination (general) (routine) without abnormal findings: Secondary | ICD-10-CM

## 2017-07-18 DIAGNOSIS — N926 Irregular menstruation, unspecified: Secondary | ICD-10-CM | POA: Diagnosis not present

## 2017-07-18 DIAGNOSIS — Z113 Encounter for screening for infections with a predominantly sexual mode of transmission: Secondary | ICD-10-CM | POA: Insufficient documentation

## 2017-07-18 DIAGNOSIS — Z0000 Encounter for general adult medical examination without abnormal findings: Secondary | ICD-10-CM

## 2017-07-18 MED ORDER — VALACYCLOVIR HCL 500 MG PO TABS
ORAL_TABLET | ORAL | 1 refills | Status: DC
Start: 2017-07-18 — End: 2019-05-19

## 2017-07-18 NOTE — Patient Instructions (Signed)
EXERCISE AND DIET:  We recommended that you start or continue a regular exercise program for good health. Regular exercise means any activity that makes your heart beat faster and makes you sweat.  We recommend exercising at least 30 minutes per day at least 3 days a week, preferably 4 or 5.  We also recommend a diet low in fat and sugar.  Inactivity, poor dietary choices and obesity can cause diabetes, heart attack, stroke, and kidney damage, among others.    ALCOHOL AND SMOKING:  Women should limit their alcohol intake to no more than 7 drinks/beers/glasses of wine (combined, not each!) per week. Moderation of alcohol intake to this level decreases your risk of breast cancer and liver damage. And of course, no recreational drugs are part of a healthy lifestyle.  And absolutely no smoking or even second hand smoke. Most people know smoking can cause heart and lung diseases, but did you know it also contributes to weakening of your bones? Aging of your skin?  Yellowing of your teeth and nails?  CALCIUM AND VITAMIN D:  Adequate intake of calcium and Vitamin D are recommended.  The recommendations for exact amounts of these supplements seem to change often, but generally speaking 600 mg of calcium (either carbonate or citrate) and 800 units of Vitamin D per day seems prudent. Certain women may benefit from higher intake of Vitamin D.  If you are among these women, your doctor will have told you during your visit.    PAP SMEARS:  Pap smears, to check for cervical cancer or precancers,  have traditionally been done yearly, although recent scientific advances have shown that most women can have pap smears less often.  However, every woman still should have a physical exam from her gynecologist every year. It will include a breast check, inspection of the vulva and vagina to check for abnormal growths or skin changes, a visual exam of the cervix, and then an exam to evaluate the size and shape of the uterus and  ovaries.  And after 31 years of age, a rectal exam is indicated to check for rectal cancers. We will also provide age appropriate advice regarding health maintenance, like when you should have certain vaccines, screening for sexually transmitted diseases, bone density testing, colonoscopy, mammograms, etc.   MAMMOGRAMS:  All women over 40 years old should have a yearly mammogram. Many facilities now offer a "3D" mammogram, which may cost around $50 extra out of pocket. If possible,  we recommend you accept the option to have the 3D mammogram performed.  It both reduces the number of women who will be called back for extra views which then turn out to be normal, and it is better than the routine mammogram at detecting truly abnormal areas.    COLONOSCOPY:  Colonoscopy to screen for colon cancer is recommended for all women at age 50.  We know, you hate the idea of the prep.  We agree, BUT, having colon cancer and not knowing it is worse!!  Colon cancer so often starts as a polyp that can be seen and removed at colonscopy, which can quite literally save your life!  And if your first colonoscopy is normal and you have no family history of colon cancer, most women don't have to have it again for 10 years.  Once every ten years, you can do something that may end up saving your life, right?  We will be happy to help you get it scheduled when you are ready.    Be sure to check your insurance coverage so you understand how much it will cost.  It may be covered as a preventative service at no cost, but you should check your particular policy.      Breast Self-Awareness Breast self-awareness means being familiar with how your breasts look and feel. It involves checking your breasts regularly and reporting any changes to your health care provider. Practicing breast self-awareness is important. A change in your breasts can be a sign of a serious medical problem. Being familiar with how your breasts look and feel allows  you to find any problems early, when treatment is more likely to be successful. All women should practice breast self-awareness, including women who have had breast implants. How to do a breast self-exam One way to learn what is normal for your breasts and whether your breasts are changing is to do a breast self-exam. To do a breast self-exam: Look for Changes  1. Remove all the clothing above your waist. 2. Stand in front of a mirror in a room with good lighting. 3. Put your hands on your hips. 4. Push your hands firmly downward. 5. Compare your breasts in the mirror. Look for differences between them (asymmetry), such as: ? Differences in shape. ? Differences in size. ? Puckers, dips, and bumps in one breast and not the other. 6. Look at each breast for changes in your skin, such as: ? Redness. ? Scaly areas. 7. Look for changes in your nipples, such as: ? Discharge. ? Bleeding. ? Dimpling. ? Redness. ? A change in position. Feel for Changes  Carefully feel your breasts for lumps and changes. It is best to do this while lying on your back on the floor and again while sitting or standing in the shower or tub with soapy water on your skin. Feel each breast in the following way:  Place the arm on the side of the breast you are examining above your head.  Feel your breast with the other hand.  Start in the nipple area and make  inch (2 cm) overlapping circles to feel your breast. Use the pads of your three middle fingers to do this. Apply light pressure, then medium pressure, then firm pressure. The light pressure will allow you to feel the tissue closest to the skin. The medium pressure will allow you to feel the tissue that is a little deeper. The firm pressure will allow you to feel the tissue close to the ribs.  Continue the overlapping circles, moving downward over the breast until you feel your ribs below your breast.  Move one finger-width toward the center of the body.  Continue to use the  inch (2 cm) overlapping circles to feel your breast as you move slowly up toward your collarbone.  Continue the up and down exam using all three pressures until you reach your armpit.  Write Down What You Find  Write down what is normal for each breast and any changes that you find. Keep a written record with breast changes or normal findings for each breast. By writing this information down, you do not need to depend only on memory for size, tenderness, or location. Write down where you are in your menstrual cycle, if you are still menstruating. If you are having trouble noticing differences in your breasts, do not get discouraged. With time you will become more familiar with the variations in your breasts and more comfortable with the exam. How often should I examine my breasts? Examine   your breasts every month. If you are breastfeeding, the best time to examine your breasts is after a feeding or after using a breast pump. If you menstruate, the best time to examine your breasts is 5-7 days after your period is over. During your period, your breasts are lumpier, and it may be more difficult to notice changes. When should I see my health care provider? See your health care provider if you notice:  A change in shape or size of your breasts or nipples.  A change in the skin of your breast or nipples, such as a reddened or scaly area.  Unusual discharge from your nipples.  A lump or thick area that was not there before.  Pain in your breasts.  Anything that concerns you.  This information is not intended to replace advice given to you by your health care provider. Make sure you discuss any questions you have with your health care provider. Document Released: 04/24/2005 Document Revised: 09/30/2015 Document Reviewed: 03/14/2015 Elsevier Interactive Patient Education  2018 Elsevier Inc.  

## 2017-07-18 NOTE — Progress Notes (Signed)
32 y.o. G1P1 Single African American F here for annual exam.  She found out that her partner had another partner. Desires STD testing. She does c/o a slight increase in vaginal d/c, she did have an odor, used left over metro gel for 2 days and her cycle started, the odor is gone. She has had a long term issue with recurrent BV since she was 15. No dyspareunia.  She has h/o genital HSV, infrequent outbreaks.  Cycles q 27-37 days (recent change) Period Cycle (Days): 32 Period Duration (Days): 6  Period Pattern: (!) Irregular Menstrual Flow: Moderate Menstrual Control: Tampon Menstrual Control Change Freq (Hours): 2-3  Dysmenorrhea: (!) Mild Dysmenorrhea Symptoms: Cramping   She has very large breasts, has chronic shoulder pain, sometimes needs muscle relaxants. She also gets upper back and neck pain. Would like to get a breast reduction, knows she needs to quit smoking first.   Patient's last menstrual period was 07/10/2017.          Sexually active: Yes.    The current method of family planning is tubal ligation.    Exercising: No.  The patient does not participate in regular exercise at present. Smoker:  Yes, 5-10 cigarettes a day, no current plans to quit.   Health Maintenance: Pap:  2017 in [place], normal per patient History of abnormal Pap:  no MMG:  no Colonoscopy:  no BMD:   no TDaP:  >10 years  Gardasil: completed in [place]   reports that she has been smoking cigarettes.  she has never used smokeless tobacco. She reports that she drinks alcohol. She reports that she uses drugs. Drug: Marijuana. She drinks a couple of drinks a day. She moved here from [place], almost years ago. 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 Son is 10, lives with her.   Past Medical History:  Diagnosis Date  . Genital herpes   . STD (sexually transmitted disease)    chlamydia    Past Surgical History:  Procedure Laterality Date  . TUBAL LIGATION      Current Outpatient Medications  Medication Sig Dispense Refill  .  acyclovir (ZOVIRAX) 400 MG tablet as needed.   12  . valACYclovir (VALTREX) 500 MG tablet Take one tablet BID for 3 days as needed 30 tablet 1   No current facility-administered medications for this visit.     Family History  Problem Relation Age of Onset  . Diabetes Paternal Grandfather   . Hypertension Paternal Grandfather     Review of Systems  Constitutional: Negative.   HENT: Negative.   Eyes: Negative.   Respiratory: Negative.   Cardiovascular: Negative.   Gastrointestinal: Negative.   Endocrine: Negative.   Genitourinary:       Vaginal discharge  Musculoskeletal: Negative.   Skin: Negative.   Allergic/Immunologic: Negative.   Neurological: Negative.   Hematological: Negative.   Psychiatric/Behavioral: Negative.     Exam:   BP (!) 92/54 (BP Location: Right Arm, Patient Position: Sitting, Cuff Size: Large)   Pulse 82   Resp 16   Ht 5' 6.5" (1.689 m)   Wt 244 lb (110.7 kg)   LMP 07/10/2017   BMI 38.79 kg/m   Weight change: @WEIGHTCHANGE @ Height:   Height: 5' 6.5" (168.9 cm)  Ht Readings from Last 3 Encounters:  07/18/17 5' 6.5" (1.689 m)    General appearance: alert, cooperative and appears stated age Head: Normocephalic, without obvious abnormality, atraumatic Neck: no adenopathy, supple, symmetrical, trachea midline and thyroid normal to inspection and palpation Lungs: clear  to auscultation bilaterally Cardiovascular: split S1, grade 2/6 SEM Breasts: normal appearance, no masses or tenderness, large, pendulous  Abdomen: soft, non-tender; non distended,  no masses,  no organomegaly Extremities: extremities normal, atraumatic, no cyanosis or edema Skin: Skin color, texture, turgor normal. No rashes or lesions Lymph nodes: Cervical, supraclavicular, and axillary nodes normal. No abnormal inguinal nodes palpated Neurologic: Grossly normal   Pelvic: External genitalia:  no lesions              Urethra:  normal appearing urethra with no masses, tenderness or  lesions              Bartholins and Skenes: normal                 Vagina: normal appearing vagina with normal color and slight increase in watery, white vaginal d/c              Cervix: no lesions               Bimanual Exam:  Uterus:  normal size, contour, position, consistency, mobility, non-tender and anteverted              Adnexa: no mass, fullness, tenderness               Rectovaginal: declined  Chaperone was present for exam.  A:  Well Woman with normal exam  Vaginal discharge, c/o recurrent long term issues with BV  Recent irregular cycles, cycles range from 27-37 days  Large pendulous breasts, has chronic shoulder pain, also has neck and upper back pain  Split S1, mild murmur (denies history)  Vaginal d/c   Smoking, encouraged to quit  ETOH, recommended she cut down, reviewed guidelines  BMI 38  P:   Pap with hpv, GC/CT  Screening STD  Affirm  Screening labs  TSH, prolactin, HgbA1C  TDAP today  Discussed breast self exam  Discussed calcium and vit D intake  Referral to primary MD, needs to establish care and for cardiac evaluation  She will call when she is ready to referral to a plastic surgeon for breast reduction consultation

## 2017-07-19 ENCOUNTER — Telehealth: Payer: Self-pay | Admitting: *Deleted

## 2017-07-19 LAB — VAGINITIS/VAGINOSIS, DNA PROBE
Candida Species: NEGATIVE
Gardnerella vaginalis: NEGATIVE
Trichomonas vaginosis: NEGATIVE

## 2017-07-19 LAB — LIPID PANEL
Chol/HDL Ratio: 3.8 ratio (ref 0.0–4.4)
Cholesterol, Total: 192 mg/dL (ref 100–199)
HDL: 51 mg/dL (ref >39)
LDL Calculated: 125 mg/dL — ABNORMAL HIGH (ref 0–99)
Triglycerides: 82 mg/dL (ref 0–149)
VLDL Cholesterol Cal: 16 mg/dL (ref 5–40)

## 2017-07-19 LAB — COMPREHENSIVE METABOLIC PANEL
ALT: 21 IU/L (ref 0–32)
AST: 24 IU/L (ref 0–40)
Albumin/Globulin Ratio: 1.6 (ref 1.2–2.2)
Albumin: 4.3 g/dL (ref 3.5–5.5)
Alkaline Phosphatase: 110 IU/L (ref 39–117)
BUN/Creatinine Ratio: 14 (ref 9–23)
BUN: 11 mg/dL (ref 6–20)
Bilirubin Total: 0.3 mg/dL (ref 0.0–1.2)
CO2: 23 mmol/L (ref 20–29)
Calcium: 9.4 mg/dL (ref 8.7–10.2)
Chloride: 102 mmol/L (ref 96–106)
Creatinine, Ser: 0.81 mg/dL (ref 0.57–1.00)
GFR calc Af Amer: 113 mL/min/{1.73_m2} (ref >59)
GFR calc non Af Amer: 98 mL/min/{1.73_m2} (ref >59)
Globulin, Total: 2.7 g/dL (ref 1.5–4.5)
Glucose: 77 mg/dL (ref 65–99)
Potassium: 4.5 mmol/L (ref 3.5–5.2)
Sodium: 139 mmol/L (ref 134–144)
Total Protein: 7 g/dL (ref 6.0–8.5)

## 2017-07-19 LAB — HEP, RPR, HIV PANEL
HIV Screen 4th Generation wRfx: NONREACTIVE
Hepatitis B Surface Ag: NEGATIVE
RPR Ser Ql: NONREACTIVE

## 2017-07-19 LAB — CBC
Hematocrit: 39.6 % (ref 34.0–46.6)
Hemoglobin: 12.4 g/dL (ref 11.1–15.9)
MCH: 28.8 pg (ref 26.6–33.0)
MCHC: 31.3 g/dL — ABNORMAL LOW (ref 31.5–35.7)
MCV: 92 fL (ref 79–97)
Platelets: 352 10*3/uL (ref 150–379)
RBC: 4.31 x10E6/uL (ref 3.77–5.28)
RDW: 17.2 % — ABNORMAL HIGH (ref 12.3–15.4)
WBC: 8 10*3/uL (ref 3.4–10.8)

## 2017-07-19 LAB — PROLACTIN: Prolactin: 29.3 ng/mL — ABNORMAL HIGH (ref 4.8–23.3)

## 2017-07-19 LAB — TSH: TSH: 1.34 u[IU]/mL (ref 0.450–4.500)

## 2017-07-19 LAB — HEMOGLOBIN A1C
Est. average glucose Bld gHb Est-mCnc: 105 mg/dL
Hgb A1c MFr Bld: 5.3 % (ref 4.8–5.6)

## 2017-07-19 LAB — HEPATITIS C ANTIBODY: Hep C Virus Ab: <0.1 s/co ratio (ref 0.0–0.9)

## 2017-07-19 NOTE — Telephone Encounter (Signed)
Spoke with Candise BowensJen at [facility]/Dr. Earlene PlaterWallace. Advised as seen below per Dr. Oscar LaJertson. Was advised will review with provider and return call to advise.

## 2017-07-19 NOTE — Telephone Encounter (Signed)
Please advise 

## 2017-07-19 NOTE — Telephone Encounter (Signed)
See note.   Copied from CRM [id number]. Topic: Appointment Scheduling - New Patient >> Jul 19, 2017  1:16 PM Oneal GroutSebastian, Jennifer S wrote:  [facility] Hubert AzureGertson calling, seen pt on 07/18/17, provider heard a mild murmur and split S1 sound. Would like to know if Dr Earlene PlaterWallace would accept patient as primary or does she need a cardiac eval 07/19/17.

## 2017-07-19 NOTE — Telephone Encounter (Addendum)
-----   Message from Romualdo BolkJill Evelyn Jertson, MD sent at 07/18/2017  5:14 PM EDT ----- Please call Dr Natalia LeatherwoodWallaces office and set this patient up for a new patient visit. She needs to establish care and she has a split S1 heart sound and a mild murmur. Please check if Dr Earlene PlaterWallace wants to evaluate her first or if we should send her for a cardiac evaluation?

## 2017-07-19 NOTE — Telephone Encounter (Signed)
Go ahead and get her in.

## 2017-07-20 LAB — CYTOLOGY - PAP
Chlamydia: NEGATIVE
Diagnosis: NEGATIVE
HPV: NOT DETECTED
Neisseria Gonorrhea: NEGATIVE

## 2017-07-20 NOTE — Telephone Encounter (Signed)
App made per patient she did not want to be seen until April. Per request I have mailed directions to our office from her home address.

## 2017-07-26 ENCOUNTER — Other Ambulatory Visit: Payer: Self-pay | Admitting: *Deleted

## 2017-07-26 DIAGNOSIS — R7989 Other specified abnormal findings of blood chemistry: Secondary | ICD-10-CM

## 2017-07-26 DIAGNOSIS — E229 Hyperfunction of pituitary gland, unspecified: Principal | ICD-10-CM

## 2017-07-26 MED ORDER — METRONIDAZOLE 500 MG PO TABS: 2000.0000 mg | ORAL_TABLET | Freq: Once | ORAL | 0 refills | Status: AC

## 2017-08-08 ENCOUNTER — Ambulatory Visit: Payer: 59 | Admitting: Family Medicine

## 2017-08-08 DIAGNOSIS — Z0289 Encounter for other administrative examinations: Secondary | ICD-10-CM

## 2017-08-08 NOTE — Progress Notes (Deleted)
Paula Tyler is a 32 y.o. female is here TO ESTABLISH CARE.  History of Present Illness:   HPI:  There are no preventive care reminders to display for this patient.   No flowsheet data found.   PMHx, SurgHx, SocialHx, FamHx, Medications, and Allergies were reviewed in the Visit Navigator and updated as appropriate.  There are no active problems to display for this patient.  Social History   Tobacco Use  . Smoking status: Current Every Day Smoker    Types: Cigarettes  . Smokeless tobacco: Never Used  Substance Use Topics  . Alcohol use: Yes  . Drug use: Yes    Types: Marijuana   Current Medications and Allergies:   Current Outpatient Medications:  .  acyclovir (ZOVIRAX) 400 MG tablet, as needed. , Disp: , Rfl: 12 .  valACYclovir (VALTREX) 500 MG tablet, Take one tablet BID for 3 days as needed, Disp: 30 tablet, Rfl: 1  No Known Allergies Review of Systems   Pertinent items are noted in the HPI. Otherwise, ROS is negative.  Vitals:  There were no vitals filed for this visit.   There is no height or weight on file to calculate BMI. Physical Exam:   Physical Exam Results for orders placed or performed in visit on 07/18/17  Vaginitis/Vaginosis, DNA Probe  Result Value Ref Range   Candida Species Negative Negative   Gardnerella vaginalis Negative Negative   Trichomonas vaginosis Negative Negative  CBC  Result Value Ref Range   WBC 8.0 3.4 - 10.8 x10E3/uL   RBC 4.31 3.77 - 5.28 x10E6/uL   Hemoglobin 12.4 11.1 - 15.9 g/dL   Hematocrit 16.1 09.6 - 46.6 %   MCV 92 79 - 97 fL   MCH 28.8 26.6 - 33.0 pg   MCHC 31.3 (L) 31.5 - 35.7 g/dL   RDW 04.5 (H) 40.9 - 81.1 %   Platelets 352 150 - 379 x10E3/uL  Comprehensive metabolic panel  Result Value Ref Range   Glucose 77 65 - 99 mg/dL   BUN 11 6 - 20 mg/dL   Creatinine, Ser 9.14 0.57 - 1.00 mg/dL   GFR calc non Af Amer 98 >59 mL/min/1.73   GFR calc Af Amer 113 >59 mL/min/1.73   BUN/Creatinine Ratio 14 9 - 23   Sodium 139 134 - 144 mmol/L   Potassium 4.5 3.5 - 5.2 mmol/L   Chloride 102 96 - 106 mmol/L   CO2 23 20 - 29 mmol/L   Calcium 9.4 8.7 - 10.2 mg/dL   Total Protein 7.0 6.0 - 8.5 g/dL   Albumin 4.3 3.5 - 5.5 g/dL   Globulin, Total 2.7 1.5 - 4.5 g/dL   Albumin/Globulin Ratio 1.6 1.2 - 2.2   Bilirubin Total 0.3 0.0 - 1.2 mg/dL   Alkaline Phosphatase 110 39 - 117 IU/L   AST 24 0 - 40 IU/L   ALT 21 0 - 32 IU/L  Hemoglobin A1c  Result Value Ref Range   Hgb A1c MFr Bld 5.3 4.8 - 5.6 %   Est. average glucose Bld gHb Est-mCnc 105 mg/dL  Lipid panel  Result Value Ref Range   Cholesterol, Total 192 100 - 199 mg/dL   Triglycerides 82 0 - 149 mg/dL   HDL 51 >78 mg/dL   VLDL Cholesterol Cal 16 5 - 40 mg/dL   LDL Calculated 295 (H) 0 - 99 mg/dL   Chol/HDL Ratio 3.8 0.0 - 4.4 ratio  TSH  Result Value Ref Range   TSH 1.340 0.450 -  4.500 uIU/mL  Prolactin  Result Value Ref Range   Prolactin 29.3 (H) 4.8 - 23.3 ng/mL  HEP, RPR, HIV Panel  Result Value Ref Range   Hepatitis B Surface Ag Negative Negative   RPR Ser Ql Non Reactive Non Reactive   HIV Screen 4th Generation wRfx Non Reactive Non Reactive  Hepatitis C antibody  Result Value Ref Range   Hep C Virus Ab <0.1 0.0 - 0.9 s/co ratio  Cytology - PAP  Result Value Ref Range   Adequacy      Satisfactory for evaluation  endocervical/transformation zone component PRESENT.   Diagnosis      NEGATIVE FOR INTRAEPITHELIAL LESIONS OR MALIGNANCY.   Diagnosis TRICHOMONAS VAGINALIS PRESENT.    Chlamydia Negative    Neisseria gonorrhea Negative    HPV NOT DETECTED    Material Submitted CervicoVaginal Pap [ThinPrep Imaged]     Assessment and Plan:   There are no diagnoses linked to this encounter.  . Reviewed expectations re: course of current medical issues. . Discussed self-management of symptoms. . Outlined signs and symptoms indicating need for more acute intervention. . Patient verbalized understanding and all questions were  answered. Marland Kitchen. Health Maintenance issues including appropriate healthy diet, exercise, and smoking avoidance were discussed with patient. . See orders for this visit as documented in the electronic medical record. . Patient received an After Visit Summary.  Helane RimaErica Shere Eisenhart, DO [place] 08/08/2017  Future Appointments  Date Time Provider Department Center  08/08/2017  8:00 AM Helane RimaWallace, Derak Schurman, DO [facility]  08/16/2017  8:15 AM Romualdo BolkJertson, Jill Evelyn, MD [facility] None

## 2017-08-09 ENCOUNTER — Encounter: Payer: Self-pay | Admitting: Family Medicine

## 2017-08-16 ENCOUNTER — Encounter: Payer: Self-pay | Admitting: Obstetrics and Gynecology

## 2017-08-16 ENCOUNTER — Ambulatory Visit (INDEPENDENT_AMBULATORY_CARE_PROVIDER_SITE_OTHER): Payer: 59 | Admitting: Obstetrics and Gynecology

## 2017-08-16 ENCOUNTER — Other Ambulatory Visit: Payer: Self-pay

## 2017-08-16 VITALS — BP 110/70 | HR 64 | Resp 14 | Wt 240.0 lb

## 2017-08-16 DIAGNOSIS — Z8619 Personal history of other infectious and parasitic diseases: Secondary | ICD-10-CM

## 2017-08-16 DIAGNOSIS — E229 Hyperfunction of pituitary gland, unspecified: Secondary | ICD-10-CM

## 2017-08-16 DIAGNOSIS — R7989 Other specified abnormal findings of blood chemistry: Secondary | ICD-10-CM

## 2017-08-16 NOTE — Addendum Note (Signed)
Addended by: Shelda JakesHANNER, MARTHA E on: 08/16/2017 02:49 PM   Modules accepted: Orders

## 2017-08-16 NOTE — Progress Notes (Signed)
GYNECOLOGY  VISIT   HPI: 32 y.o.   Single  African American  female   G1P1 with Patient's last menstrual period was 08/10/2017.   here for follow up and recheck for trichomonas. She was treated and hasn't been sexually active again since then. She is no longer with that partner.  She had a mildly elevated prolactin level at her annual exam. It was done secondary to a recent change in her cycles, they went from monthly to q 27-37 days.  Her cycle has been regular the last 2 months  GYNECOLOGIC HISTORY: Patient's last menstrual period was 08/10/2017. Contraception:none Menopausal hormone therapy: none         OB History    Gravida  1   Para  1   Term      Preterm      AB      Living        SAB      TAB      Ectopic      Multiple      Live Births                 There are no active problems to display for this patient.   Past Medical History:  Diagnosis Date  . Genital herpes   . STD (sexually transmitted disease)    chlamydia    Past Surgical History:  Procedure Laterality Date  . TUBAL LIGATION      Current Outpatient Medications  Medication Sig Dispense Refill  . acyclovir (ZOVIRAX) 400 MG tablet as needed.   12  . valACYclovir (VALTREX) 500 MG tablet Take one tablet BID for 3 days as needed 30 tablet 1   No current facility-administered medications for this visit.      ALLERGIES: Patient has no known allergies.  Family History  Problem Relation Age of Onset  . Diabetes Paternal Grandfather   . Hypertension Paternal Grandfather     Social History   Socioeconomic History  . Marital status: Single    Spouse name: Not on file  . Number of children: Not on file  . Years of education: Not on file  . Highest education level: Not on file  Occupational History  . Not on file  Social Needs  . Financial resource strain: Not on file  . Food insecurity:    Worry: Not on file    Inability: Not on file  . Transportation needs:    Medical:  Not on file    Non-medical: Not on file  Tobacco Use  . Smoking status: Current Every Day Smoker    Types: Cigarettes  . Smokeless tobacco: Never Used  Substance and Sexual Activity  . Alcohol use: Yes  . Drug use: Yes    Types: Marijuana  . Sexual activity: Yes    Birth control/protection: Surgical    Comment: BTL   Lifestyle  . Physical activity:    Days per week: Not on file    Minutes per session: Not on file  . Stress: Not on file  Relationships  . Social connections:    Talks on phone: Not on file    Gets together: Not on file    Attends religious service: Not on file    Active member of club or organization: Not on file    Attends meetings of clubs or organizations: Not on file    Relationship status: Not on file  . Intimate partner violence:    Fear of current or ex  partner: Not on file    Emotionally abused: Not on file    Physically abused: Not on file    Forced sexual activity: Not on file  Other Topics Concern  . Not on file  Social History Narrative  . Not on file    Review of Systems  Constitutional: Negative.   HENT: Negative.   Eyes: Negative.   Respiratory: Negative.   Cardiovascular: Negative.   Gastrointestinal: Negative.   Genitourinary: Negative.   Musculoskeletal: Negative.   Skin: Negative.   Neurological: Negative.   Endo/Heme/Allergies: Negative.   Psychiatric/Behavioral: Negative.     PHYSICAL EXAMINATION:    BP 110/70 (BP Location: Right Arm, Patient Position: Sitting, Cuff Size: Normal)   Pulse 64   Resp 14   Wt 240 lb (108.9 kg)   LMP 08/10/2017   BMI 38.16 kg/m     General appearance: alert, cooperative and appears stated age  Pelvic: External genitalia:  no lesions              Urethra:  normal appearing urethra with no masses, tenderness or lesions              Bartholins and Skenes: normal                 Vagina: normal appearing vagina with normal color and a slight watery, bloody discharge, no lesions               Cervix: no lesions   Chaperone was present for exam.  ASSESSMENT H/O trich, s/p treatment Recent change in cycles and mildly elevated prolactin    PLAN Test of cure for trich Prolactin level drawn   An After Visit Summary was printed and given to the patient.

## 2017-08-16 NOTE — Patient Instructions (Signed)

## 2017-08-17 LAB — VAGINITIS/VAGINOSIS, DNA PROBE
Candida Species: NEGATIVE
Gardnerella vaginalis: POSITIVE — AB
Trichomonas vaginosis: NEGATIVE

## 2017-08-17 LAB — PROLACTIN: Prolactin: 21.9 ng/mL (ref 4.8–23.3)

## 2017-08-31 ENCOUNTER — Telehealth: Payer: Self-pay | Admitting: Obstetrics and Gynecology

## 2017-08-31 ENCOUNTER — Ambulatory Visit: Payer: Self-pay | Admitting: Obstetrics and Gynecology

## 2017-08-31 NOTE — Telephone Encounter (Signed)
Left message to call Paula Tyler at [phone].   Reviewed concerns with business office, see account notes.

## 2017-08-31 NOTE — Telephone Encounter (Signed)
Spoke with patient. Patient reports hx of ovarian cyst, is concerned that she has one now. Reports sharp, tugging, pressure on lower left side. Pain started 2 days ago. Naproxen prn helps for pain relief, currently 5/10, took last dose 30 minutes ago.   Reports regular menses, LMP 08/10/17. Denies fever/chills, N/V, urinary complaints, vag d/c or odor.   Recommended OV for further evaluation, OV scheduled for today at 1:30pm with Dr. Oscar LaJertson. Patient has additional questions/concerns about copay, advised will review with business office for return call.   Routing to provider for final review. Patient is agreeable to disposition. Will close encounter.  Cc: Soledad GerlachAndrea Alexander

## 2017-08-31 NOTE — Telephone Encounter (Signed)
Patient dnka her appointment for left side pain. I left a message for her to call and reschedule. This appointment was scheduled earlier today,

## 2017-08-31 NOTE — Telephone Encounter (Signed)
Patient called after hours and left a message on our answering machine stating she is experiencing pain like an ovarian cyst. She'd like to speak with the nurse to make a plan and give more details.  Last seen: 08/16/17

## 2017-10-24 ENCOUNTER — Telehealth: Payer: Self-pay

## 2017-10-24 ENCOUNTER — Other Ambulatory Visit: Payer: Self-pay

## 2017-10-24 DIAGNOSIS — N898 Other specified noninflammatory disorders of vagina: Secondary | ICD-10-CM

## 2017-10-24 MED ORDER — FLUCONAZOLE 150 MG PO TABS: 150.0000 mg | ORAL_TABLET | Freq: Once | ORAL | 0 refills | Status: AC

## 2017-10-24 NOTE — Progress Notes (Signed)
Rx for Diflucan sent per protocol.

## 2017-10-24 NOTE — Telephone Encounter (Signed)
Return call to pt requesting Diflucan  Sent per protocol Pt advised if no relief to contact the office for a appointment.

## 2019-05-16 NOTE — Progress Notes (Addendum)
34 y.o. G1P1 Married (02/13/2019)  African American Fe here for annual exam. Period in 02/16/2019( after marriage) was 9 days duration, usually only 6 days, November was 03/27/2019 was 6 days and December was normal.  Aware of weight gain of 19 pounds. Still smoking and trying to decrease amount. Also uses marijuana occasional. (aware still illegal). Patient also has alcohol daily. Contraception Tubal ligation. Desires STD screening yearly to make sure she is "OK", no symptoms. Had noted some pelvic pain on right occasional, no GI issues or urinary issues. Not present today. Also noted some tenderness in right breast occasionally, feels related to bra. "Hard to find one that fits".. Does SBE and plans to have breast reduction in the next year due breast size. Has PCP appointment in 07/2019 for labs and exam.. 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 No other health issues today.  Patient's last menstrual period was 04/28/2019 (exact date).          Sexually active: Yes.    The current method of family planning is tubal ligation.    Exercising: No.  exercise Smoker:  no  Review of Systems  Constitutional: Negative.   HENT: Negative.   Eyes: Negative.   Respiratory: Negative.   Cardiovascular: Negative.   Gastrointestinal: Negative.   Genitourinary: Negative.        Pelvic pain on rt side  Musculoskeletal: Negative.   Skin:       Pain in rt breast  Neurological: Negative.   Endo/Heme/Allergies: Negative.   Psychiatric/Behavioral: Negative.     Health Maintenance: Pap:  07-18-17 neg HPV HR neg, trichomonas + ( neg on affirm test) History of Abnormal Pap: no MMG:  none Self Breast exams: yes Colonoscopy: none BMD:   none TDaP: 2019 Shingles: no Pneumonia: no Hep C and HIV: both neg 2019 Labs: if needed   reports that she has been smoking cigarettes. She has never used smokeless tobacco. She reports current alcohol use. She reports current drug use. Drug:  Marijuana.  Past Medical History:  Diagnosis Date  . Genital herpes   . STD (sexually transmitted disease)    chlamydia    Past Surgical History:  Procedure Laterality Date  . TUBAL LIGATION      Current Outpatient Medications  Medication Sig Dispense Refill  . acyclovir (ZOVIRAX) 400 MG tablet as needed.   12  . Ascorbic Acid (VITAMIN C PO) Take by mouth.    . Multiple Vitamin (MULTIVITAMIN PO) Take by mouth.    . valACYclovir (VALTREX) 500 MG tablet Take one tablet BID for 3 days as needed 30 tablet 1   No current facility-administered medications for this visit.    Family History  Problem Relation Age of Onset  . Diabetes Paternal Grandfather   . Hypertension Paternal Grandfather     ROS:  Pertinent items are noted in HPI.  Otherwise, a comprehensive ROS was negative.  Exam:   BP 118/80   Pulse 70   Temp (!) 97 F (36.1 C) (Skin)   Resp 16   Ht 5' 6.75" (1.695 m)   Wt 259 lb (117.5 kg)   LMP 04/28/2019 (Exact Date)   BMI 40.87 kg/m  Height: 5' 6.75" (169.5 cm) Ht Readings from Last 3 Encounters:  05/19/19 5' 6.75" (1.695 m)  07/18/17 5' 6.5" (1.689 m)    General appearance: alert, cooperative and appears stated age Head: Normocephalic, without obvious abnormality, atraumatic Neck: no adenopathy, supple, symmetrical, trachea midline and thyroid normal  to inspection and palpation Lungs: clear to auscultation bilaterally Breasts: No nipple retraction or dimpling, No nipple discharge or bleeding, No axillary or supraclavicular adenopathy, Normal to palpation without dominant masses, no tenderness on palpation bilateral. Very large pendulous breasts bilaterally. Heart: regular rate and rhythm Abdomen: soft, non-tender; no masses,  no organomegaly Extremities: extremities normal, atraumatic, no cyanosis or edema Skin: Skin color, texture, turgor normal. No rashes or lesions Lymph nodes: Cervical, supraclavicular, and axillary nodes normal. No abnormal inguinal  nodes palpated Neurologic: Grossly normal   Pelvic: External genitalia:  no lesions, normal female appearance              Urethra:  normal appearing urethra with no masses, tenderness or lesions              Bartholin's and Skene's: normal                 Vagina: normal appearing vagina with normal color and yellow white discharge, no lesions  Affirm taken              Cervix: no cervical motion tenderness, no lesions and retroverted              Pap taken: Yes.   Bimanual Exam:  Uterus:  normal size, contour, position, consistency, mobility, non-tender and anteverted              Adnexa: normal adnexa and no mass, fullness, tenderness               Rectovaginal: Confirms               Anus:  normal sphincter tone, no lesions  Chaperone present: yes  A:  Well Woman with normal exam  Contraception tubal ligation  Large pendulous breasts bilateral normal exam, patient planning breast reduction at some point.  STD screening  Weight gain since marriage  Smoker  History of HSV 2 needs Valtrex update  P:   Reviewed health and wellness pertinent to exam  Discussed changing from underwire to elastic support to see if this is more comfortable. Stressed SBE and mammogram at 41. Call if changes noted.  Discussed regular exercise and watch dietary intake to help with weight control  Smoking cessation discussed and available resources. Encouraged to limit alcohol and drug use and risks.  Rx Valtrex see order with instructions  Lab: HIV,Hep C, RPR, Affirm, GC/Chlamydia  Pap smear: yes   counseled on breast self exam, STD prevention, HIV risk factors and prevention, feminine hygiene, adequate intake of calcium and vitamin D, diet and exercise  return annually or prn  An After Visit Summary was printed and given to the patient.

## 2019-05-19 ENCOUNTER — Encounter: Payer: Self-pay | Admitting: Certified Nurse Midwife

## 2019-05-19 ENCOUNTER — Ambulatory Visit (INDEPENDENT_AMBULATORY_CARE_PROVIDER_SITE_OTHER): Payer: Federal, State, Local not specified - PPO | Admitting: Certified Nurse Midwife

## 2019-05-19 ENCOUNTER — Other Ambulatory Visit: Payer: Self-pay

## 2019-05-19 ENCOUNTER — Other Ambulatory Visit (HOSPITAL_COMMUNITY)
Admission: RE | Admit: 2019-05-19 | Discharge: 2019-05-19 | Disposition: A | Discharge status: Home / Self Care | Payer: Medicaid Other | Source: Ambulatory Visit | Attending: Obstetrics & Gynecology | Admitting: Obstetrics & Gynecology

## 2019-05-19 VITALS — BP 118/80 | HR 70 | Temp 97.0°F | Resp 16 | Ht 66.75 in | Wt 259.0 lb

## 2019-05-19 DIAGNOSIS — Z113 Encounter for screening for infections with a predominantly sexual mode of transmission: Secondary | ICD-10-CM

## 2019-05-19 DIAGNOSIS — A6009 Herpesviral infection of other urogenital tract: Secondary | ICD-10-CM

## 2019-05-19 DIAGNOSIS — Z124 Encounter for screening for malignant neoplasm of cervix: Secondary | ICD-10-CM | POA: Insufficient documentation

## 2019-05-19 DIAGNOSIS — Z01419 Encounter for gynecological examination (general) (routine) without abnormal findings: Secondary | ICD-10-CM | POA: Diagnosis not present

## 2019-05-19 MED ORDER — VALACYCLOVIR HCL 500 MG PO TABS: ORAL_TABLET | ORAL | 12 refills | Status: DC

## 2019-05-19 NOTE — Patient Instructions (Signed)

## 2019-05-20 LAB — CYTOLOGY - PAP
Chlamydia: NEGATIVE
Comment: NEGATIVE
Comment: NEGATIVE
Comment: NORMAL
Diagnosis: NEGATIVE
High risk HPV: NEGATIVE
Neisseria Gonorrhea: NEGATIVE

## 2019-05-20 LAB — HEPATITIS C ANTIBODY: Hep C Virus Ab: <0.1 s/co ratio (ref 0.0–0.9)

## 2019-05-20 LAB — RPR: RPR Ser Ql: NONREACTIVE

## 2019-05-20 LAB — HIV ANTIBODY (ROUTINE TESTING W REFLEX): HIV Screen 4th Generation wRfx: NONREACTIVE

## 2019-05-21 ENCOUNTER — Telehealth: Payer: Self-pay

## 2019-05-21 LAB — VAGINITIS/VAGINOSIS, DNA PROBE
Candida Species: NEGATIVE
Gardnerella vaginalis: POSITIVE — AB
Trichomonas vaginosis: NEGATIVE

## 2019-05-21 MED ORDER — CLINDAMYCIN PHOSPHATE 2 % VA CREA: 1.0000 | TOPICAL_CREAM | Freq: Every day | VAGINAL | 0 refills | Status: AC

## 2019-05-21 NOTE — Telephone Encounter (Signed)
-----   Message from Verner Chol, CNM sent at 05/21/2019  8:25 AM EST ----- Addendum: Notify patient regarding BV as previous note, but due to alcohol use, will not use Flagyl. Rx Cleocin cream 2 % one applicator every hs x 7.

## 2019-05-21 NOTE — Telephone Encounter (Signed)
Patient returned call

## 2019-05-21 NOTE — Telephone Encounter (Signed)
Patient notified of results. rx sent to pharmacy. 

## 2019-05-21 NOTE — Telephone Encounter (Signed)
Left message for call back.

## 2019-05-26 ENCOUNTER — Telehealth: Payer: Self-pay | Admitting: *Deleted

## 2019-05-26 NOTE — Telephone Encounter (Signed)
PA request received from [facility] for clindamycin vaginal cream.   Call to patient. Patient states she thought medication was out of stock. Patient states she has not provided new insurance information to pharmacy. Patient also states she does not currently have any symptoms of BV, is unsure if she will treat with Rx at this time. Advised patient it is ultimately her decision, RX was sent based on 05/19/19 affirm results positive for BV. Advised patient if she desires treatment and needs a PA or alternative medication after she f/u with pharmacy, return call to office. Patient verbalizes understanding.   Routing to [employer], CNM FYI.   Encounter closed.

## 2019-06-24 DIAGNOSIS — Z20828 Contact with and (suspected) exposure to other viral communicable diseases: Secondary | ICD-10-CM | POA: Diagnosis not present

## 2019-07-08 DIAGNOSIS — Z0000 Encounter for general adult medical examination without abnormal findings: Secondary | ICD-10-CM | POA: Diagnosis not present

## 2019-07-08 DIAGNOSIS — F419 Anxiety disorder, unspecified: Secondary | ICD-10-CM | POA: Diagnosis not present

## 2019-07-09 ENCOUNTER — Telehealth (HOSPITAL_COMMUNITY): Payer: Self-pay | Admitting: Licensed Clinical Social Worker

## 2019-07-10 NOTE — Telephone Encounter (Signed)
Referral received from [facility] Physician for CDIOP. Unable to reach client after 2 attempts. Client able to schedule with clinician if contacts office.

## 2019-07-25 ENCOUNTER — Encounter: Payer: Self-pay | Admitting: Certified Nurse Midwife

## 2019-08-01 DIAGNOSIS — K589 Irritable bowel syndrome without diarrhea: Secondary | ICD-10-CM | POA: Diagnosis not present

## 2019-08-08 ENCOUNTER — Other Ambulatory Visit: Payer: Self-pay | Admitting: Gastroenterology

## 2019-08-08 DIAGNOSIS — K589 Irritable bowel syndrome without diarrhea: Secondary | ICD-10-CM

## 2019-08-20 ENCOUNTER — Other Ambulatory Visit: Payer: Medicaid Other

## 2019-08-27 ENCOUNTER — Telehealth: Payer: Self-pay

## 2019-08-27 NOTE — Telephone Encounter (Signed)
Spoke with pt.  AEX 05/19/19 with DL  Pt states wanting a referral for a surgeon in [place] for breast reduction surgery. 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 Pt wanting recommendations who Dr Oscar La knows and trust.   Routing to Dr Oscar La for referral.

## 2019-08-27 NOTE — Telephone Encounter (Signed)
Patient called in regards to needing a referral for breast reduction. Patient stated she needs to speak with nurse for more details.

## 2019-08-29 NOTE — Telephone Encounter (Signed)
I would send her to Dr Benna Dunks

## 2019-09-01 NOTE — Telephone Encounter (Signed)
Spoke with pt. Pt given recommendations per Dr Oscar La.  Pt states has called there before and they do not take insurance.   Pt to call Dr Dillingham;s office and [facility]. Phone numbers given. Pt  will return call to office for who she wants to see for referral to be placed. Pt agreeable and thankful for advice.   Routing to Dr Oscar La for review.  Encounter closed.

## 2019-09-08 ENCOUNTER — Ambulatory Visit
Admission: RE | Admit: 2019-09-08 | Discharge: 2019-09-08 | Disposition: A | Discharge status: Home / Self Care | Payer: Medicaid Other | Source: Ambulatory Visit | Attending: Gastroenterology | Admitting: Gastroenterology

## 2019-09-08 DIAGNOSIS — K589 Irritable bowel syndrome without diarrhea: Secondary | ICD-10-CM

## 2019-10-01 DIAGNOSIS — N62 Hypertrophy of breast: Secondary | ICD-10-CM | POA: Diagnosis not present

## 2020-01-21 ENCOUNTER — Telehealth: Payer: Self-pay | Admitting: Obstetrics and Gynecology

## 2020-01-21 NOTE — Telephone Encounter (Signed)
Patient is having irregular cycles and wants to come in to be seen.

## 2020-01-21 NOTE — Telephone Encounter (Signed)
AEX 05/2019 with DL H/o irreg cycles  H/o HSV 2, on Valtrex BTL  Spoke with pt. Pt states having irregular cycles for the last 2 months. Pt states cycle was LMP 7/20, then again on 12/22/19. Pt advised this was 28 days in between cycles and normal. Pt states now late for cycle and has not started as of today. Pt took home UPT and was negative today. Pt should have started by 01/20/20 at 30 day mark. Pt denies any BTB, heavy cycle bleeding or clots. Pt denies cramps or any abd pain, NVD.   Pt also states having hot flashes and night sweats for the last 2 months, where she is awakened, soaked at night and having 2-3 hot flashes intermittently during the day and changes in mood swings with cycles.   Pt denies any early family menopause. Pt states mother at hysterectomy at age 62.  Advised pt to have OV to discuss with Dr Oscar La. Pt agreeable. Pt scheduled on 9/21 at 230 pm for OV. Pt agreeable to date and time of appt. Encounter closed.

## 2020-01-26 ENCOUNTER — Telehealth: Payer: Self-pay | Admitting: Obstetrics and Gynecology

## 2020-01-26 NOTE — Telephone Encounter (Signed)
Patient cancelled appointment Wednesday for irregular cycles and would like to reschedule. Sending to triage to assist with scheduling.

## 2020-01-26 NOTE — Telephone Encounter (Signed)
Spoke with pt. Pt states having to cancel appt on 9/22 due to work schedule. Pt rescheduled for 02/19/20 at 1145 am. Pt agreeable to date and time of appt. Pt advised to be seen earlier than OV if sx worsen or heavy, soaking bleeding occurs and seek ER or PCP. Pt verbalized understanding. Pt advised to continue to monitor/calendar cycles until OV. Pt agreeable. Pt declines all other offered appts.  Encounter closed.

## 2020-01-26 NOTE — Telephone Encounter (Signed)
Left message for pt to return call to triage RN. 

## 2020-01-27 ENCOUNTER — Ambulatory Visit: Payer: Federal, State, Local not specified - PPO | Admitting: Obstetrics and Gynecology

## 2020-02-19 ENCOUNTER — Ambulatory Visit: Payer: Federal, State, Local not specified - PPO | Admitting: Obstetrics and Gynecology

## 2020-03-02 ENCOUNTER — Other Ambulatory Visit: Payer: Self-pay

## 2020-03-02 ENCOUNTER — Encounter: Payer: Self-pay | Admitting: Obstetrics and Gynecology

## 2020-03-02 ENCOUNTER — Ambulatory Visit (INDEPENDENT_AMBULATORY_CARE_PROVIDER_SITE_OTHER): Payer: Federal, State, Local not specified - PPO | Admitting: Obstetrics and Gynecology

## 2020-03-02 VITALS — BP 124/64 | HR 86 | Ht 67.0 in | Wt 250.0 lb

## 2020-03-02 DIAGNOSIS — F418 Other specified anxiety disorders: Secondary | ICD-10-CM

## 2020-03-02 DIAGNOSIS — N926 Irregular menstruation, unspecified: Secondary | ICD-10-CM

## 2020-03-02 DIAGNOSIS — F1010 Alcohol abuse, uncomplicated: Secondary | ICD-10-CM | POA: Diagnosis not present

## 2020-03-02 MED ORDER — CITALOPRAM HYDROBROMIDE 20 MG PO TABS: ORAL_TABLET | ORAL | 1 refills | Status: DC

## 2020-03-02 NOTE — Progress Notes (Signed)
GYNECOLOGY  VISIT   HPI: 34 y.o.   Married Black or [place] American Not Hispanic or Latino  female   G1P1 with Patient's last menstrual period was 02/26/2020.   here for irregular cycles. She states that she has been regular all her life until last year. She says that her Mood swings and pms is really bad. She is having night sweats. She is more depressed.  Her cycles have ranged from 3.5-4.5 weeks in the last year. She bleeds x 6 days, can saturate 4-5 super tampons in a day.  She c/o PMS 6-8 days prior to her cycle, ends with her cycle. PMS symptoms: very emotional, bad mood swings.  For the last couple of weeks she has been feeling depressed, anxious. Over thinking things.  She has recently stopped drinking. She was drinking 5 days a week, a pint to a 5th of cognac. She smokes weed, not an issue. She has cut back on her drinking in the last several days.  Off of it she is bored. Doesn't effect work, parenting.  Married last year, great husband but her enables her drinking. Son is 13.  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 Likes her job.   GYNECOLOGIC HISTORY: Patient's last menstrual period was 02/26/2020. Contraception:tubal ligation  Menopausal hormone therapy: none         OB History    Gravida  1   Para  1   Term      Preterm      AB      Living        SAB      TAB      Ectopic      Multiple      Live Births                 There are no problems to display for this patient.   Past Medical History:  Diagnosis Date  . Genital herpes   . STD (sexually transmitted disease)    chlamydia    Past Surgical History:  Procedure Laterality Date  . TUBAL LIGATION      Current Outpatient Medications  Medication Sig Dispense Refill  . Ascorbic Acid (VITAMIN C PO) Take by mouth.    . Cholecalciferol (VITAMIN D3 PO) Take by mouth.    . Multiple Vitamin (MULTIVITAMIN PO) Take by mouth.    . Multiple Vitamins-Minerals (ZINC PO) Take 50 mg by mouth.    .  valACYclovir (VALTREX) 500 MG tablet Take one tablet BID for 3 days as needed 30 tablet 12   No current facility-administered medications for this visit.     ALLERGIES: Patient has no known allergies.  Family History  Problem Relation Age of Onset  . Diabetes Paternal Grandfather   . Hypertension Paternal Grandfather     Social History   Socioeconomic History  . Marital status: Married    Spouse name: Not on file  . Number of children: Not on file  . Years of education: Not on file  . Highest education level: Not on file  Occupational History  . Not on file  Tobacco Use  . Smoking status: Current Every Day Smoker    Types: Cigarettes  . Smokeless tobacco: Never Used  Vaping Use  . Vaping Use: Never used  Substance and Sexual Activity  . Alcohol use: Yes    Comment: a pint a day  . Drug use: Yes    Types: Marijuana  . Sexual activity: Yes  Birth control/protection: Surgical    Comment: BTL   Other Topics Concern  . Not on file  Social History Narrative  . Not on file   Social Determinants of Health   Financial Resource Strain:   . Difficulty of Paying Living Expenses: Not on file  Food Insecurity:   . Worried About Programme researcher, broadcasting/film/video in the Last Year: Not on file  . Ran Out of Food in the Last Year: Not on file  Transportation Needs:   . Lack of Transportation (Medical): Not on file  . Lack of Transportation (Non-Medical): Not on file  Physical Activity:   . Days of Exercise per Week: Not on file  . Minutes of Exercise per Session: Not on file  Stress:   . Feeling of Stress : Not on file  Social Connections:   . Frequency of Communication with Friends and Family: Not on file  . Frequency of Social Gatherings with Friends and Family: Not on file  . Attends Religious Services: Not on file  . Active Member of Clubs or Organizations: Not on file  . Attends Banker Meetings: Not on file  . Marital Status: Not on file  Intimate Partner  Violence:   . Fear of Current or Ex-Partner: Not on file  . Emotionally Abused: Not on file  . Physically Abused: Not on file  . Sexually Abused: Not on file    Review of Systems  Genitourinary:       Irregular periods.   Psychiatric/Behavioral: Positive for depression. The patient is nervous/anxious.   All other systems reviewed and are negative.   PHYSICAL EXAMINATION:    BP 124/64   Pulse 86   Ht 5\' 7"  (1.702 m)   Wt 250 lb (113.4 kg)   LMP 02/26/2020   SpO2 99%   BMI 39.16 kg/m     General appearance: alert, cooperative and appears stated age Neck: no adenopathy, supple, symmetrical, trachea midline and thyroid normal to inspection and palpation  ASSESSMENT Depression/anxiety, self medicating with ETOH ETOH abuse Irregular cycles, still within the realm of normal    PLAN TSH, liver panel She has cut back on her drinking, will start going to AA She will see her primary about medication for ETOH abuse Start Celexa for depression and anxiety, warned her about Celexa worsening effects of ETOH use.  Name of counselor given Also recommend couple counseling, husband in enabling her.     An After Visit Summary was printed and given to the patient.   Over 30 minutes in total patient care.

## 2020-03-02 NOTE — Patient Instructions (Addendum)
Counselor: Paula Tyler [phone]  Living With Depression Everyone experiences occasional disappointment, sadness, and loss in their lives. When you are feeling down, blue, or sad for at least 2 weeks in a row, it may mean that you have depression. Depression can affect your thoughts and feelings, relationships, daily activities, and physical health. It is caused by changes in the way your brain functions. If you receive a diagnosis of depression, your health care provider will tell you which type of depression you have and what treatment options are available to you. If you are living with depression, there are ways to help you recover from it and also ways to prevent it from coming back. How to cope with lifestyle changes Coping with stress     Stress is your body's reaction to life changes and events, both good and bad. Stressful situations may include:  Getting married.  The death of a spouse.  Losing a job.  Retiring.  Having a baby. Stress can last just a few hours or it can be ongoing. Stress can play a major role in depression, so it is important to learn both how to cope with stress and how to think about it differently. Talk with your health care provider or a counselor if you would like to learn more about stress reduction. He or she may suggest some stress reduction techniques, such as:  Music therapy. This can include creating music or listening to music. Choose music that you enjoy and that inspires you.  Mindfulness-based meditation. This kind of meditation can be done while sitting or walking. It involves being aware of your normal breaths, rather than trying to control your breathing.  Centering prayer. This is a kind of meditation that involves focusing on a spiritual word or phrase. Choose a word, phrase, or sacred image that is meaningful to you and that brings you peace.  Deep breathing. To do this, expand your stomach and inhale slowly through your nose. Hold  your breath for 3-5 seconds, then exhale slowly, allowing your stomach muscles to relax.  Muscle relaxation. This involves intentionally tensing muscles then relaxing them. Choose a stress reduction technique that fits your lifestyle and personality. Stress reduction techniques take time and practice to develop. Set aside 5-15 minutes a day to do them. Therapists can offer training in these techniques. The training may be covered by some insurance plans. Other things you can do to manage stress include:  Keeping a stress diary. This can help you learn what triggers your stress and ways to control your response.  Understanding what your limits are and saying no to requests or events that lead to a schedule that is too full.  Thinking about how you respond to certain situations. You may not be able to control everything, but you can control how you react.  Adding humor to your life by watching funny films or TV shows.  Making time for activities that help you relax and not feeling guilty about spending your time this way.  Medicines Your health care provider may suggest certain medicines if he or she feels that they will help improve your condition. Avoid using alcohol and other substances that may prevent your medicines from working properly (may interact). It is also important to:  Talk with your pharmacist or health care provider about all the medicines that you take, their possible side effects, and what medicines are safe to take together.  Make it your goal to take part in all treatment decisions (shared decision-making).  This includes giving input on the side effects of medicines. It is best if shared decision-making with your health care provider is part of your total treatment plan. If your health care provider prescribes a medicine, you may not notice the full benefits of it for 4-8 weeks. Most people who are treated for depression need to be on medicine for at least 6-12 months after  they feel better. If you are taking medicines as part of your treatment, do not stop taking medicines without first talking to your health care provider. You may need to have the medicine slowly decreased (tapered) over time to decrease the risk of harmful side effects. Relationships Your health care provider may suggest family therapy along with individual therapy and drug therapy. While there may not be family problems that are causing you to feel depressed, it is still important to make sure your family learns as much as they can about your mental health. Having your family's support can help make your treatment successful. How to recognize changes in your condition Everyone has a different response to treatment for depression. Recovery from major depression happens when you have not had signs of major depression for two months. This may mean that you will start to:  Have more interest in doing activities.  Feel less hopeless than you did 2 months ago.  Have more energy.  Overeat less often, or have better or improving appetite.  Have better concentration. Your health care provider will work with you to decide the next steps in your recovery. It is also important to recognize when your condition is getting worse. Watch for these signs:  Having fatigue or low energy.  Eating too much or too little.  Sleeping too much or too little.  Feeling restless, agitated, or hopeless.  Having trouble concentrating or making decisions.  Having unexplained physical complaints.  Feeling irritable, angry, or aggressive. Get help as soon as you or your family members notice these symptoms coming back. How to get support and help from others How to talk with friends and family members about your condition  Talking to friends and family members about your condition can provide you with one way to get support and guidance. Reach out to trusted friends or family members, explain your symptoms to them,  and let them know that you are working with a health care provider to treat your depression. Financial resources Not all insurance plans cover mental health care, so it is important to check with your insurance carrier. If paying for co-pays or counseling services is a problem, search for a local or county mental health care center. They may be able to offer public mental health care services at low or no cost when you are not able to see a private health care provider. If you are taking medicine for depression, you may be able to get the generic form, which may be less expensive. Some makers of prescription medicines also offer help to patients who cannot afford the medicines they need. Follow these instructions at home:   Get the right amount and quality of sleep.  Cut down on using caffeine, tobacco, alcohol, and other potentially harmful substances.  Try to exercise, such as walking or lifting small weights.  Take over-the-counter and prescription medicines only as told by your health care provider.  Eat a healthy diet that includes plenty of vegetables, fruits, whole grains, low-fat dairy products, and lean protein. Do not eat a lot of foods that are high in solid  fats, added sugars, or salt.  Keep all follow-up visits as told by your health care provider. This is important. Contact a health care provider if:  You stop taking your antidepressant medicines, and you have any of these symptoms: ? Nausea. ? Headache. ? Feeling lightheaded. ? Chills and body aches. ? Not being able to sleep (insomnia).  You or your friends and family think your depression is getting worse. Get help right away if:  You have thoughts of hurting yourself or others. If you ever feel like you may hurt yourself or others, or have thoughts about taking your own life, get help right away. You can go to your nearest emergency department or call:  Your local emergency services (911 in the [place]  A suicide  crisis helpline, such as the National Suicide Prevention Lifeline at 724-465-1495. This is open 24-hours a day. Summary  If you are living with depression, there are ways to help you recover from it and also ways to prevent it from coming back.  Work with your health care team to create a management plan that includes counseling, stress management techniques, and healthy lifestyle habits. This information is not intended to replace advice given to you by your health care provider. Make sure you discuss any questions you have with your health care provider. Document Revised: 08/16/2018 Document Reviewed: 03/27/2016 Elsevier Patient Education  2020 Else[employer] Inc. Managing Anxiety, Adult After being diagnosed with an anxiety disorder, you may be relieved to know why you have felt or behaved a certain way. You may also feel overwhelmed about the treatment ahead and what it will mean for your life. With care and support, you can manage this condition and recover from it. How to manage lifestyle changes Managing stress and anxiety  Stress is your body's reaction to life changes and events, both good and bad. Most stress will last just a few hours, but stress can be ongoing and can lead to more than just stress. Although stress can play a major role in anxiety, it is not the same as anxiety. Stress is usually caused by something external, such as a deadline, test, or competition. Stress normally passes after the triggering event has ended.  Anxiety is caused by something internal, such as imagining a terrible outcome or worrying that something will go wrong that will devastate you. Anxiety often does not go away even after the triggering event is over, and it can become long-term (chronic) worry. It is important to understand the differences between stress and anxiety and to manage your stress effectively so that it does not lead to an anxious response. Talk with your health care provider or a counselor  to learn more about reducing anxiety and stress. He or she may suggest tension reduction techniques, such as:  Music therapy. This can include creating or listening to music that you enjoy and that inspires you.  Mindfulness-based meditation. This involves being aware of your normal breaths while not trying to control your breathing. It can be done while sitting or walking.  Centering prayer. This involves focusing on a word, phrase, or sacred image that means something to you and brings you peace.  Deep breathing. To do this, expand your stomach and inhale slowly through your nose. Hold your breath for 3-5 seconds. Then exhale slowly, letting your stomach muscles relax.  Self-talk. This involves identifying thought patterns that lead to anxiety reactions and changing those patterns.  Muscle relaxation. This involves tensing muscles and then relaxing them. Choose  a tension reduction technique that suits your lifestyle and personality. These techniques take time and practice. Set aside 5-15 minutes a day to do them. Therapists can offer counseling and training in these techniques. The training to help with anxiety may be covered by some insurance plans. Other things you can do to manage stress and anxiety include:  Keeping a stress/anxiety diary. This can help you learn what triggers your reaction and then learn ways to manage your response.  Thinking about how you react to certain situations. You may not be able to control everything, but you can control your response.  Making time for activities that help you relax and not feeling guilty about spending your time in this way.  Visual imagery and yoga can help you stay calm and relax.  Medicines Medicines can help ease symptoms. Medicines for anxiety include:  Anti-anxiety drugs.  Antidepressants. Medicines are often used as a primary treatment for anxiety disorder. Medicines will be prescribed by a health care provider. When used  together, medicines, psychotherapy, and tension reduction techniques may be the most effective treatment. Relationships Relationships can play a big part in helping you recover. Try to spend more time connecting with trusted friends and family members. Consider going to couples counseling, taking family education classes, or going to family therapy. Therapy can help you and others better understand your condition. How to recognize changes in your anxiety Everyone responds differently to treatment for anxiety. Recovery from anxiety happens when symptoms decrease and stop interfering with your daily activities at home or work. This may mean that you will start to:  Have better concentration and focus. Worry will interfere less in your daily thinking.  Sleep better.  Be less irritable.  Have more energy.  Have improved memory. It is important to recognize when your condition is getting worse. Contact your health care provider if your symptoms interfere with home or work and you feel like your condition is not improving. Follow these instructions at home: Activity  Exercise. Most adults should do the following: ? Exercise for at least 150 minutes each week. The exercise should increase your heart rate and make you sweat (moderate-intensity exercise). ? Strengthening exercises at least twice a week.  Get the right amount and quality of sleep. Most adults need 7-9 hours of sleep each night. Lifestyle   Eat a healthy diet that includes plenty of vegetables, fruits, whole grains, low-fat dairy products, and lean protein. Do not eat a lot of foods that are high in solid fats, added sugars, or salt.  Make choices that simplify your life.  Do not use any products that contain nicotine or tobacco, such as cigarettes, e-cigarettes, and chewing tobacco. If you need help quitting, ask your health care provider.  Avoid caffeine, alcohol, and certain over-the-counter cold medicines. These may make  you feel worse. Ask your pharmacist which medicines to avoid. General instructions  Take over-the-counter and prescription medicines only as told by your health care provider.  Keep all follow-up visits as told by your health care provider. This is important. Where to find support You can get help and support from these sources:  Self-help groups.  Online and [employer].  A trusted spiritual leader.  Couples counseling.  Family education classes.  Family therapy. Where to find more information You may find that joining a support group helps you deal with your anxiety. The following sources can help you locate counselors or support groups near you:  Mental Health America: www.mentalhealthamerica.net  Anxiety  and Depression Association of [place] (ADAA): ProgramCam.de  [employer] on Mental Illness (NAMI): www.nami.org Contact a health care provider if you:  Have a hard time staying focused or finishing daily tasks.  Spend many hours a day feeling worried about everyday life.  Become exhausted by worry.  Start to have headaches, feel tense, or have nausea.  Urinate more than normal.  Have diarrhea. Get help right away if you have:  A racing heart and shortness of breath.  Thoughts of hurting yourself or others. If you ever feel like you may hurt yourself or others, or have thoughts about taking your own life, get help right away. You can go to your nearest emergency department or call:  Your local emergency services (911 in the [place]  A suicide crisis helpline, such as the National Suicide Prevention Lifeline at (747) 330-2161. This is open 24 hours a day. Summary  Taking steps to learn and use tension reduction techniques can help calm you and help prevent triggering an anxiety reaction.  When used together, medicines, psychotherapy, and tension reduction techniques may be the most effective treatment.  Family, friends, and partners can play  a big part in helping you recover from an anxiety disorder. This information is not intended to replace advice given to you by your health care provider. Make sure you discuss any questions you have with your health care provider. Document Revised: 09/24/2018 Document Reviewed: 09/24/2018 Elsevier Patient Education  2020 [employer].  Alcohol Abuse and Dependence Information, Adult Alcohol is a widely available drug. People drink alcohol in different amounts. People who drink alcohol very often and in large amounts often have problems during and after drinking. They may develop what is called an alcohol use disorder. There are two main types of alcohol use disorders:  Alcohol abuse. This is when you use alcohol too much or too often. You may use alcohol to make yourself feel happy or to reduce stress. You may have a hard time setting a limit on the amount you drink.  Alcohol dependence. This is when you use alcohol consistently for a period of time, and your body changes as a result. This can make it hard to stop drinking because you may start to feel sick or feel different when you do not use alcohol. These symptoms are known as withdrawal. How can alcohol abuse and dependence affect me? Alcohol abuse and dependence can have a negative effect on your life. Drinking too much can lead to addiction. You may feel like you need alcohol to function normally. You may drink alcohol before work in the morning, during the day, or as soon as you get home from work in the evening. These actions can result in:  Poor work performance.  Job loss.  Financial problems.  Car crashes or criminal charges from driving after drinking alcohol.  Problems in your relationships with friends and family.  Losing the trust and respect of coworkers, friends, and family. Drinking heavily over a long period of time can permanently damage your body and brain, and can cause lifelong health issues, such as:  Damage to  your liver or pancreas.  Heart problems, high blood pressure, or stroke.  Certain cancers.  Decreased ability to fight infections.  Brain or nerve damage.  Depression.  Early (premature) death. If you are careless or you crave alcohol, it is easy to drink more than your body can handle (overdose). Alcohol overdose is a serious situation that requires hospitalization. It may lead to permanent injuries  or death. What can increase my risk?  Having a family history of alcohol abuse.  Having depression or other mental health conditions.  Beginning to drink at an early age.  Binge drinking often.  Experiencing trauma, stress, and an unstable home life during childhood.  Spending time with people who drink often. What actions can I take to prevent or manage alcohol abuse and dependence?  Do not drink alcohol if: ? Your health care provider tells you not to drink. ? You are pregnant, may be pregnant, or are planning to become pregnant.  If you drink alcohol: ? Limit how much you use to:  0-1 drink a day for women.  0-2 drinks a day for men. ? Be aware of how much alcohol is in your drink. In the [place] one drink equals one 12 oz bottle of beer (355 mL), one 5 oz glass of wine (148 mL), or one 1 oz glass of hard liquor (44 mL).  Stop drinking if you have been drinking too much. This can be very hard to do if you are used to abusing alcohol. If you begin to have withdrawal symptoms, talk with your health care provider or a person that you trust. These symptoms may include anxiety, shaky hands, headache, nausea, sweating, or not being able to sleep.  Choose to drink nonalcoholic beverages in social gatherings and places where there may be alcohol. Activity  Spend more time on activities that you enjoy that do not involve alcohol, like hobbies or exercise.  Find healthy ways to cope with stress, such as exercise, meditation, or spending time with people you care about. General  information  Talk to your family, coworkers, and friends about supporting you in your efforts to stop drinking. If they drink, ask them not to drink around you. Spend more time with people who do not drink alcohol.  If you think that you have an alcohol dependency problem: ? Tell friends or family about your concerns. ? Talk with your health care provider or another health professional about where to get help. ? Work with a Paramedic and a Network engineer. ? Consider joining a support group for people who struggle with alcohol abuse and dependence. Where to find support   Your health care provider.  SMART Recovery: www.smartrecovery.org Therapy and support groups  Local treatment centers or chemical dependency counselors.  Local AA groups in your community: SalaryStart.tn Where to find more information  Centers for Disease Control and Prevention: FootballExhibition.com.br  [employer] on Alcohol Abuse and Alcoholism: BasicStudents.dk  Alcoholics Anonymous (AA): SalaryStart.tn Contact a health care provider if:  You drank more or for longer than you intended on more than one occasion.  You tried to stop drinking or to cut back on how much you drink, but you were not able to.  You often drink to the point of vomiting or passing out.  You want to drink so badly that you cannot think about anything else.  You have problems in your life due to drinking, but you continue to drink.  You keep drinking even though you feel anxious, depressed, or have experienced memory loss.  You have stopped doing the things you used to enjoy in order to drink.  You have to drink more than you used to in order to get the effect you want.  You experience anxiety, sweating, nausea, shakiness, and trouble sleeping when you try to stop drinking. Get help right away if:  You have thoughts about hurting yourself or  others.  You have serious withdrawal symptoms, including: ? Confusion. ? Racing  heart. ? High blood pressure. ? Fever. If you ever feel like you may hurt yourself or others, or have thoughts about taking your own life, get help right away. You can go to your nearest emergency department or call:  Your local emergency services (911 in the [place]  A suicide crisis helpline, such as the National Suicide Prevention Lifeline at (830)847-1972. This is open 24 hours a day. Summary  Alcohol abuse and dependence can have a negative effect on your life. Drinking too much or too often can lead to addiction.  If you drink alcohol, limit how much you use.  If you are having trouble keeping your drinking under control, find ways to change your behavior. Hobbies, calming activities, exercise, or support groups can help.  If you feel you need help with changing your drinking habits, talk with your health care provider, a good friend, or a therapist, or go to an AA group. This information is not intended to replace advice given to you by your health care provider. Make sure you discuss any questions you have with your health care provider. Document Revised: 08/13/2018 Document Reviewed: 07/02/2018 Elsevier Patient Education  2020 [employer].

## 2020-03-03 ENCOUNTER — Telehealth: Payer: Self-pay

## 2020-03-03 LAB — HEPATIC FUNCTION PANEL
ALT: 34 IU/L — ABNORMAL HIGH (ref 0–32)
AST: 25 IU/L (ref 0–40)
Albumin: 4.5 g/dL (ref 3.8–4.8)
Alkaline Phosphatase: 116 IU/L (ref 44–121)
Bilirubin Total: 0.2 mg/dL (ref 0.0–1.2)
Bilirubin, Direct: 0.11 mg/dL (ref 0.00–0.40)
Total Protein: 7.4 g/dL (ref 6.0–8.5)

## 2020-03-03 LAB — TSH: TSH: 1.8 u[IU]/mL (ref 0.450–4.500)

## 2020-03-03 NOTE — Telephone Encounter (Signed)
Patient is calling to discuss lab results.

## 2020-03-03 NOTE — Telephone Encounter (Signed)
Routing to Dr. Oscar La to review 03/02/20 labs and advise.

## 2020-03-04 ENCOUNTER — Other Ambulatory Visit: Payer: Self-pay | Admitting: Obstetrics and Gynecology

## 2020-03-04 DIAGNOSIS — R7989 Other specified abnormal findings of blood chemistry: Secondary | ICD-10-CM

## 2020-03-04 NOTE — Telephone Encounter (Signed)
I sent the patient a mychart message today. I didn't see this note prior to that. Will you please see my note and call her to discuss. Thanks, Noreene Larsson

## 2020-03-05 NOTE — Telephone Encounter (Signed)
Hi [name], One of your liver function tests is mildly elevated. Cutting back on your alcohol intake should help. Your thyroid screen is normal.  I would recommend that we repeat your liver panel in 2 months. I will put an order in, please just c ...  Written by Romualdo Bolk, MD on 03/04/2020 1:04 PM EDT View Full Comments Seen by patient Paula Tyler on 03/04/2020 1:10 PM   Spoke with patient. No additional questions. Patient declines to schedule f/u labs at this time, will return call to the office. Lab order placed. Patient verbalizes understanding.   Encounter closed.

## 2020-03-29 ENCOUNTER — Other Ambulatory Visit: Payer: Federal, State, Local not specified - PPO

## 2020-03-29 DIAGNOSIS — J069 Acute upper respiratory infection, unspecified: Secondary | ICD-10-CM | POA: Diagnosis not present

## 2020-03-30 DIAGNOSIS — J069 Acute upper respiratory infection, unspecified: Secondary | ICD-10-CM | POA: Diagnosis not present

## 2020-03-30 DIAGNOSIS — Z20822 Contact with and (suspected) exposure to covid-19: Secondary | ICD-10-CM | POA: Diagnosis not present

## 2020-05-24 ENCOUNTER — Ambulatory Visit: Payer: Federal, State, Local not specified - PPO | Admitting: Certified Nurse Midwife

## 2020-07-14 ENCOUNTER — Telehealth: Payer: Self-pay | Admitting: *Deleted

## 2020-07-14 ENCOUNTER — Other Ambulatory Visit: Payer: Self-pay | Admitting: Nurse Practitioner

## 2020-07-14 DIAGNOSIS — N943 Premenstrual tension syndrome: Secondary | ICD-10-CM

## 2020-07-14 MED ORDER — CITALOPRAM HYDROBROMIDE 20 MG PO TABS: ORAL_TABLET | ORAL | 0 refills | Status: DC

## 2020-07-14 NOTE — Telephone Encounter (Signed)
Patient called requesting refill on Celexa 20 mg tablet, last filled on 03/02/20 with 1 refill.  Annual exam scheduled with you on 07/30/20.

## 2020-07-14 NOTE — Progress Notes (Signed)
Phone request for Celexa refill. Will rx enough until physical with is scheduled 3/35/2022.

## 2020-07-14 NOTE — Telephone Encounter (Signed)
Rx sent 

## 2020-07-15 NOTE — Telephone Encounter (Signed)
Patient informed. 

## 2020-07-29 NOTE — Progress Notes (Deleted)
35 y.o. G1P1 Married [place] or [place] American female here for annual exam.      No LMP recorded.            Sexually active: {yes no:314532}  The current method of family planning is tubal ligation.    Exercising: {yes no:314532}  {types:19826} Smoker:  {YES NO:22349}  Health Maintenance: Pap:  05-19-2019 neg HPV HR neg History of abnormal Pap:  no MMG:  none Colonoscopy:  none BMD:   none Gardasil:   Had 1? Covid-19: *** Hep C testing: neg 2021 Screening Labs: ***   reports that she has been smoking cigarettes. She has never used smokeless tobacco. She reports current alcohol use. She reports current drug use. Drug: Marijuana.  Past Medical History:  Diagnosis Date  . Genital herpes   . STD (sexually transmitted disease)    chlamydia    Past Surgical History:  Procedure Laterality Date  . TUBAL LIGATION      Current Outpatient Medications  Medication Sig Dispense Refill  . Ascorbic Acid (VITAMIN C PO) Take by mouth.    . Cholecalciferol (VITAMIN D3 PO) Take by mouth.    . citalopram (CELEXA) 20 MG tablet take one tablet a day 30 tablet 0  . Multiple Vitamin (MULTIVITAMIN PO) Take by mouth.    . Multiple Vitamins-Minerals (ZINC PO) Take 50 mg by mouth.    . valACYclovir (VALTREX) 500 MG tablet Take one tablet BID for 3 days as needed 30 tablet 12   No current facility-administered medications for this visit.    Family History  Problem Relation Age of Onset  . Diabetes Paternal Grandfather   . Hypertension Paternal Grandfather     Review of Systems  Exam:   There were no vitals taken for this visit.     General appearance: alert, cooperative and appears stated age, no acute distress Head: Normocephalic, without obvious abnormality Neck: no adenopathy, thyroid {EXAM; THYROID:18604} Lungs: clear to auscultation bilaterally Breasts: {Exam; breast:13139::"normal appearance, no masses or tenderness"} Heart: regular rate and rhythm Abdomen: soft, non-tender;  no masses,  no organomegaly Extremities: extremities normal, no edema Skin: No rashes or lesions Lymph nodes: Cervical, supraclavicular, and axillary nodes normal. No abnormal inguinal nodes palpated Neurologic: Grossly normal   Pelvic: External genitalia:  no lesions              Urethra:  normal appearing urethra with no masses, tenderness or lesions              Bartholins and Skenes: normal                 Vagina: normal appearing vagina, appropriate for age, normal appearing discharge, no lesions              Cervix: neg cervical motion tenderness, no visible lesions             Bimanual Exam:   Uterus:  {exam; uterus:12215}              Adnexa: {exam; adnexa:12223}                 ***, CMA Chaperone was present for exam.  A:  Well Woman with normal exam  P:   Pap :  Mammogram:  Labs:  Medications:

## 2020-07-30 ENCOUNTER — Ambulatory Visit: Payer: Federal, State, Local not specified - PPO | Admitting: Nurse Practitioner

## 2020-08-11 ENCOUNTER — Other Ambulatory Visit: Payer: Self-pay | Admitting: Nurse Practitioner

## 2020-08-11 DIAGNOSIS — N943 Premenstrual tension syndrome: Secondary | ICD-10-CM

## 2020-08-11 NOTE — Telephone Encounter (Signed)
Annual exam scheduled on 08/23/20.

## 2020-08-17 NOTE — Progress Notes (Signed)
35 y.o. G1P1 Married [place] or [place] American female here for annual exam.    Takes Celexa for anxiety and helps so much, no problems, takes only 10 mg [place] ago, moved here with mother and son.  Now married. Works at [employer]  Period Duration (Days): 5-6 Period Pattern: Regular Menstrual Flow: Moderate Menstrual Control: Tampon Dysmenorrhea: None Patient's last menstrual period was 07/26/2020 (exact date).          Sometimes has some spotting with ovulation in the last couple months. Happened in January for 1 day and March bled 3 days, heavier than spotting, not as heavy as a period. It may have happened another time as well.  Also has random pain in Left side, lower pelvic area, Pain is "3" on scale of 1-10. Last several minutes when it comes on. Happens about 4 times per month x 2-3 months, noticing more often lately.   Would like STD testing today, no symptoms but feels better with testing Sexually active: Yes.    The current method of family planning is tubal ligation & husband vasectomy.     Exercising: Yes.    some walking & [facility]ca Smoker:  no  Health Maintenance: Pap:  05-19-2019 neg HPV HR neg History of abnormal Pap:  no MMG:  none Colonoscopy:  none BMD:   none Gardasil:   Completed per patient Covid-19: not done Hep C testing: neg 2021    reports that she has been smoking cigarettes. She has never used smokeless tobacco. She reports current alcohol use. She reports current drug use. Drug: Marijuana.  Past Medical History:  Diagnosis Date  . Genital herpes   . STD (sexually transmitted disease)    chlamydia    Past Surgical History:  Procedure Laterality Date  . TUBAL LIGATION      Current Outpatient Medications  Medication Sig Dispense Refill  . citalopram (CELEXA) 20 MG tablet TAKE 1 TABLET BY MOUTH EVERY DAY 30 tablet 0  . Omega-3 Fatty Acids (FISH OIL PO) Take by mouth.     No current facility-administered medications for  this visit.    Family History  Problem Relation Age of Onset  . Diabetes Paternal Grandfather   . Hypertension Paternal Grandfather     Review of Systems  Constitutional: Negative.   HENT: Negative.   Eyes: Negative.   Respiratory: Negative.   Cardiovascular: Negative.   Gastrointestinal: Negative.   Endocrine: Negative.   Genitourinary:       Bleeding during ovulation  Musculoskeletal: Negative.   Skin: Negative.   Allergic/Immunologic: Negative.   Neurological: Negative.   Hematological: Negative.   Psychiatric/Behavioral: Negative.     Exam:   BP 108/80   Pulse 76   Resp 16   Ht 5' 7.25" (1.708 m)   Wt 256 lb (116.1 kg)   LMP 07/26/2020 (Exact Date) Comment: btl  BMI 39.80 kg/m   Height: 5' 7.25" (170.8 cm)  General appearance: alert, cooperative and appears stated age, no acute distress Head: Normocephalic, without obvious abnormality Neck: no adenopathy, thyroid normal to inspection and palpation Lungs: clear to auscultation bilaterally Breasts: No axillary or supraclavicular adenopathy, Normal to palpation without dominant masses 3 areas of raised rash over areola Heart: regular rate and rhythm Abdomen: soft, non-tender; no masses,  no organomegaly Extremities: extremities normal, no edema Skin: No rashes or lesions Lymph nodes: Cervical, supraclavicular, and axillary nodes normal. No abnormal inguinal nodes palpated Neurologic: Grossly normal   Pelvic: External genitalia:  no lesions              Urethra:  normal appearing urethra with no masses, tenderness or lesions              Bartholins and Skenes: normal                 Vagina: normal appearing vagina, appropriate for age, normal appearing discharge, no lesions              Cervix: neg cervical motion tenderness, no visible lesions             Bimanual Exam:   Uterus:  normal size, contour, position, consistency, mobility, non-tender              Adnexa: no mass, fullness, tenderness                  Joy, CMA Chaperone was present for exam.  A:  Well woman exam with routine gynecological exam  Intermenstrual bleeding - Plan: US PELVIS TRANSVAGINAL NON-OB (TV ONLY)  PMS (premenstrual syndrome) - Plan: citalopram (CELEXA) 20 MG tablet  Pelvic pain - Plan: US PELVIS TRANSVAGINAL NON-OB (TV ONLY)  Screen for sexually transmitted diseases - Plan: SURESWAB CT/NG/T. vaginalis, HIV antibody (with reflex), RPR  Rash and other nonspecific skin eruption - Plan: triamcinolone cream (KENALOG) 0.1 %    P:   Pap : done last year, WNL  Mammogram: start age 89  Labs:HIV, RPR, GC/CT/Trich  Medications: celexa refill (pt still takes only 10 mg at this time), working well for her.

## 2020-08-23 ENCOUNTER — Other Ambulatory Visit: Payer: Self-pay

## 2020-08-23 ENCOUNTER — Encounter: Payer: Self-pay | Admitting: Nurse Practitioner

## 2020-08-23 ENCOUNTER — Ambulatory Visit (INDEPENDENT_AMBULATORY_CARE_PROVIDER_SITE_OTHER): Payer: Federal, State, Local not specified - PPO | Admitting: Nurse Practitioner

## 2020-08-23 VITALS — BP 108/80 | HR 76 | Resp 16 | Ht 67.25 in | Wt 256.0 lb

## 2020-08-23 DIAGNOSIS — N923 Ovulation bleeding: Secondary | ICD-10-CM

## 2020-08-23 DIAGNOSIS — Z113 Encounter for screening for infections with a predominantly sexual mode of transmission: Secondary | ICD-10-CM

## 2020-08-23 DIAGNOSIS — R102 Pelvic and perineal pain: Secondary | ICD-10-CM

## 2020-08-23 DIAGNOSIS — N943 Premenstrual tension syndrome: Secondary | ICD-10-CM | POA: Diagnosis not present

## 2020-08-23 DIAGNOSIS — R21 Rash and other nonspecific skin eruption: Secondary | ICD-10-CM

## 2020-08-23 DIAGNOSIS — Z01419 Encounter for gynecological examination (general) (routine) without abnormal findings: Secondary | ICD-10-CM | POA: Diagnosis not present

## 2020-08-23 MED ORDER — CITALOPRAM HYDROBROMIDE 20 MG PO TABS: ORAL_TABLET | ORAL | 12 refills | Status: DC

## 2020-08-23 MED ORDER — TRIAMCINOLONE ACETONIDE 0.1 % EX CREA: TOPICAL_CREAM | CUTANEOUS | 0 refills | Status: AC

## 2020-08-23 NOTE — Patient Instructions (Signed)
Health Maintenance, Female Adopting a healthy lifestyle and getting preventive care are important in promoting health and wellness. Ask your health care provider about:  The right schedule for you to have regular tests and exams.  Things you can do on your own to prevent diseases and keep yourself healthy. What should I know about diet, weight, and exercise? Eat a healthy diet  Eat a diet that includes plenty of vegetables, fruits, low-fat dairy products, and lean protein.  Do not eat a lot of foods that are high in solid fats, added sugars, or sodium.   Maintain a healthy weight Body mass index (BMI) is used to identify weight problems. It estimates body fat based on height and weight. Your health care provider can help determine your BMI and help you achieve or maintain a healthy weight. Get regular exercise Get regular exercise. This is one of the most important things you can do for your health. Most adults should:  Exercise for at least 150 minutes each week. The exercise should increase your heart rate and make you sweat (moderate-intensity exercise).  Do strengthening exercises at least twice a week. This is in addition to the moderate-intensity exercise.  Spend less time sitting. Even light physical activity can be beneficial. Watch cholesterol and blood lipids Have your blood tested for lipids and cholesterol at 34 years of age, then have this test every 5 years. Have your cholesterol levels checked more often if:  Your lipid or cholesterol levels are high.  You are older than 34 years of age.  You are at high risk for heart disease. What should I know about cancer screening? Depending on your health history and family history, you may need to have cancer screening at various ages. This may include screening for:  Breast cancer.  Cervical cancer.  Colorectal cancer.  Skin cancer.  Lung cancer. What should I know about heart disease, diabetes, and high blood  pressure? Blood pressure and heart disease  High blood pressure causes heart disease and increases the risk of stroke. This is more likely to develop in people who have high blood pressure readings, are of African descent, or are overweight.  Have your blood pressure checked: ? Every 3-5 years if you are 18-39 years of age. ? Every year if you are 40 years old or older. Diabetes Have regular diabetes screenings. This checks your fasting blood sugar level. Have the screening done:  Once every three years after age 40 if you are at a normal weight and have a low risk for diabetes.  More often and at a younger age if you are overweight or have a high risk for diabetes. What should I know about preventing infection? Hepatitis B If you have a higher risk for hepatitis B, you should be screened for this virus. Talk with your health care provider to find out if you are at risk for hepatitis B infection. Hepatitis C Testing is recommended for:  Everyone born from 1945 through 1965.  Anyone with known risk factors for hepatitis C. Sexually transmitted infections (STIs)  Get screened for STIs, including gonorrhea and chlamydia, if: ? You are sexually active and are younger than 34 years of age. ? You are older than 34 years of age and your health care provider tells you that you are at risk for this type of infection. ? Your sexual activity has changed since you were last screened, and you are at increased risk for chlamydia or gonorrhea. Ask your health care provider   if you are at risk.  Ask your health care provider about whether you are at high risk for HIV. Your health care provider may recommend a prescription medicine to help prevent HIV infection. If you choose to take medicine to prevent HIV, you should first get tested for HIV. You should then be tested every 3 months for as long as you are taking the medicine. Pregnancy  If you are about to stop having your period (premenopausal) and  you may become pregnant, seek counseling before you get pregnant.  Take 400 to 800 micrograms (mcg) of folic acid every day if you become pregnant.  Ask for birth control (contraception) if you want to prevent pregnancy. Osteoporosis and menopause Osteoporosis is a disease in which the bones lose minerals and strength with aging. This can result in bone fractures. If you are 65 years old or older, or if you are at risk for osteoporosis and fractures, ask your health care provider if you should:  Be screened for bone loss.  Take a calcium or vitamin D supplement to lower your risk of fractures.  Be given hormone replacement therapy (HRT) to treat symptoms of menopause. Follow these instructions at home: Lifestyle  Do not use any products that contain nicotine or tobacco, such as cigarettes, e-cigarettes, and chewing tobacco. If you need help quitting, ask your health care provider.  Do not use street drugs.  Do not share needles.  Ask your health care provider for help if you need support or information about quitting drugs. Alcohol use  Do not drink alcohol if: ? Your health care provider tells you not to drink. ? You are pregnant, may be pregnant, or are planning to become pregnant.  If you drink alcohol: ? Limit how much you use to 0-1 drink a day. ? Limit intake if you are breastfeeding.  Be aware of how much alcohol is in your drink. In the [place] one drink equals one 12 oz bottle of beer (355 mL), one 5 oz glass of wine (148 mL), or one 1 oz glass of hard liquor (44 mL). General instructions  Schedule regular health, dental, and eye exams.  Stay current with your vaccines.  Tell your health care provider if: ? You often feel depressed. ? You have ever been abused or do not feel safe at home. Summary  Adopting a healthy lifestyle and getting preventive care are important in promoting health and wellness.  Follow your health care provider's instructions about healthy  diet, exercising, and getting tested or screened for diseases.  Follow your health care provider's instructions on monitoring your cholesterol and blood pressure. This information is not intended to replace advice given to you by your health care provider. Make sure you discuss any questions you have with your health care provider. Document Revised: 04/17/2018 Document Reviewed: 04/17/2018 Elsevier Patient Education  2021 Elsevier Inc.  

## 2020-08-24 LAB — SURESWAB CT/NG/T. VAGINALIS
C. trachomatis RNA, TMA: NOT DETECTED
N. gonorrhoeae RNA, TMA: NOT DETECTED
Trichomonas vaginalis RNA: NOT DETECTED

## 2020-08-24 LAB — RPR: RPR Ser Ql: NONREACTIVE

## 2020-08-24 LAB — HIV ANTIBODY (ROUTINE TESTING W REFLEX): HIV 1&2 Ab, 4th Generation: NONREACTIVE

## 2020-09-30 ENCOUNTER — Encounter: Payer: Self-pay | Admitting: Obstetrics and Gynecology

## 2020-09-30 ENCOUNTER — Ambulatory Visit (INDEPENDENT_AMBULATORY_CARE_PROVIDER_SITE_OTHER): Payer: Federal, State, Local not specified - PPO | Admitting: Obstetrics and Gynecology

## 2020-09-30 ENCOUNTER — Ambulatory Visit (INDEPENDENT_AMBULATORY_CARE_PROVIDER_SITE_OTHER): Payer: Federal, State, Local not specified - PPO

## 2020-09-30 ENCOUNTER — Other Ambulatory Visit: Payer: Self-pay

## 2020-09-30 VITALS — BP 120/84 | HR 67 | Ht 67.25 in | Wt 256.0 lb

## 2020-09-30 DIAGNOSIS — N923 Ovulation bleeding: Secondary | ICD-10-CM

## 2020-09-30 DIAGNOSIS — K580 Irritable bowel syndrome with diarrhea: Secondary | ICD-10-CM

## 2020-09-30 DIAGNOSIS — R102 Pelvic and perineal pain: Secondary | ICD-10-CM

## 2020-09-30 DIAGNOSIS — R109 Unspecified abdominal pain: Secondary | ICD-10-CM | POA: Diagnosis not present

## 2020-09-30 NOTE — Progress Notes (Signed)
GYNECOLOGY  VISIT   HPI: 35 y.o.   Married [place] or [place] American Not Hispanic or Latino  female   G1P1001 with No LMP recorded.   here for an ultrasound to evaluate intermenstrual bleeding and pelvic pain. When she saw Ms Durwin Nora for her annual exam last month she reported a few episodes of mid cycle bleeding, one time lasted for 3 days. She also c/o random left pelvic pain ~4 x a month for 2-3 months. Her pelvic exam was normal.   Menses are monthly x 5 days. She saturates a super tampon in 2-3 hours. She has been midcycle bleeding intermittently since January, it has happened 3 times. 2 times it lasted for one day, 1 time it lasted x 3 days.   No pain since April.  She has 4 BM's a day for a couple of years, very soft. She has seen a GI MD, no concerning findings on evaluation.   GYNECOLOGIC HISTORY: No LMP recorded. Contraception:tubal  Menopausal hormone therapy: none         OB History    Gravida  1   Para  1   Term  1   Preterm      AB      Living  1     SAB      IAB      Ectopic      Multiple      Live Births                 There are no problems to display for this patient.   Past Medical History:  Diagnosis Date  . Genital herpes   . STD (sexually transmitted disease)    chlamydia    Past Surgical History:  Procedure Laterality Date  . TUBAL LIGATION      Current Outpatient Medications  Medication Sig Dispense Refill  . citalopram (CELEXA) 20 MG tablet TAKE 1 TABLET BY MOUTH EVERY DAY 30 tablet 12  . Omega-3 Fatty Acids (FISH OIL PO) Take by mouth.    . triamcinolone cream (KENALOG) 0.1 % Use twice daily x 2 weeks, then twice weekly 30 g 0   No current facility-administered medications for this visit.     ALLERGIES: Patient has no known allergies.  Family History  Problem Relation Age of Onset  . Diabetes Paternal Grandfather   . Hypertension Paternal Grandfather     Social History   Socioeconomic History  . Marital status:  Married    Spouse name: Not on file  . Number of children: Not on file  . Years of education: Not on file  . Highest education level: Not on file  Occupational History  . Not on file  Tobacco Use  . Smoking status: Current Every Day Smoker    Types: Cigarettes  . Smokeless tobacco: Never Used  Vaping Use  . Vaping Use: Never used  Substance and Sexual Activity  . Alcohol use: Yes    Comment: a pint a day  . Drug use: Yes    Types: Marijuana  . Sexual activity: Yes    Birth control/protection: Surgical    Comment: BTL   Other Topics Concern  . Not on file  Social History Narrative  . Not on file   Social Determinants of Health   Financial Resource Strain: Not on file  Food Insecurity: Not on file  Transportation Needs: Not on file  Physical Activity: Not on file  Stress: Not on file  Social Connections: Not  on file  Intimate Partner Violence: Not on file    Review of Systems  All other systems reviewed and are negative.   PHYSICAL EXAMINATION:    There were no vitals taken for this visit.    General appearance: alert, cooperative and appears stated age  Pelvic ultrasound  Indications: pelvic pain, intermenstrual spotting  Findings:  Anteverted Uterus 8.41 x 4.34 x 4.13 cm  Endometrium 4.81 mm, symmetric, no masses  Left ovary 3.87 x 2.12 x 2.19 cm Collapsed, thick walled CL 8 x 8.5 mm  Right ovary 2.69 x 2.23 x 2.25 cm  No free fluid   Impression: Normal pelvic ultrasound  1. Intermenstrual bleeding Bleeding is midcycle, normal ultrasound Patient reassured, no further evaluation needed  2. Combined abdominal and pelvic pain Normal GYN exam last month, normal ultrasound today Recommended the patient calendar her pain, her cycles, her bowels. I think her pain could be GI If the pain is cycle related we discussed the option to use OCP's to see if we could suppress the pain She will let me know if she wants to try this  3. Irritable bowel  syndrome with diarrhea She has seen GI  In addition to reviewing the ultrasound images, further history was obtained and management was discussed  CC: Clarita Crane, NP

## 2021-01-05 NOTE — Progress Notes (Deleted)
GYNECOLOGY  VISIT   HPI: 35 y.o.   Married [place] or [place] American Not Hispanic or Latino  female   G1P1001 with No LMP recorded.   here for     GYNECOLOGIC HISTORY: No LMP recorded. Contraception:btl Menopausal hormone therapy: none        OB History     Gravida  1   Para  1   Term  1   Preterm      AB      Living  1      SAB      IAB      Ectopic      Multiple      Live Births                 There are no problems to display for this patient.   Past Medical History:  Diagnosis Date   Genital herpes    STD (sexually transmitted disease)    chlamydia    Past Surgical History:  Procedure Laterality Date   TUBAL LIGATION      Current Outpatient Medications  Medication Sig Dispense Refill   citalopram (CELEXA) 20 MG tablet TAKE 1 TABLET BY MOUTH EVERY DAY 30 tablet 12   Omega-3 Fatty Acids (FISH OIL PO) Take by mouth.     triamcinolone cream (KENALOG) 0.1 % Use twice daily x 2 weeks, then twice weekly 30 g 0   No current facility-administered medications for this visit.     ALLERGIES: Patient has no known allergies.  Family History  Problem Relation Age of Onset   Diabetes Paternal Grandfather    Hypertension Paternal Grandfather     Social History   Socioeconomic History   Marital status: Married    Spouse name: Not on file   Number of children: Not on file   Years of education: Not on file   Highest education level: Not on file  Occupational History   Not on file  Tobacco Use   Smoking status: Every Day    Types: Cigarettes   Smokeless tobacco: Never  Vaping Use   Vaping Use: Never used  Substance and Sexual Activity   Alcohol use: Yes    Comment: a pint a day   Drug use: Yes    Types: Marijuana   Sexual activity: Yes    Birth control/protection: Surgical    Comment: BTL   Other Topics Concern   Not on file  Social History Narrative   Not on file   Social Determinants of Health   Financial Resource Strain: Not  on file  Food Insecurity: Not on file  Transportation Needs: Not on file  Physical Activity: Not on file  Stress: Not on file  Social Connections: Not on file  Intimate Partner Violence: Not on file    ROS  PHYSICAL EXAMINATION:    There were no vitals taken for this visit.    General appearance: alert, cooperative and appears stated age Neck: no adenopathy, supple, symmetrical, trachea midline and thyroid {CHL AMB PHY EX THYROID NORM DEFAULT:[phone]::"normal to inspection and palpation"} Breasts: {Exam; breast:13139::"normal appearance, no masses or tenderness"} Abdomen: soft, non-tender; non distended, no masses,  no organomegaly  Pelvic: External genitalia:  no lesions              Urethra:  normal appearing urethra with no masses, tenderness or lesions              Bartholins and Skenes: normal  Vagina: normal appearing vagina with normal color and discharge, no lesions              Cervix: {CHL AMB PHY EX CERVIX NORM DEFAULT:[phone]::"no lesions"}              Bimanual Exam:  Uterus:  {CHL AMB PHY EX UTERUS NORM DEFAULT:[phone]::"normal size, contour, position, consistency, mobility, non-tender"}              Adnexa: {CHL AMB PHY EX ADNEXA NO MASS DEFAULT:[id number]::"no mass, fullness, tenderness"}              Rectovaginal: {yes no:314532}.  Confirms.              Anus:  normal sphincter tone, no lesions  Chaperone was present for exam.  ASSESSMENT     PLAN    An After Visit Summary was printed and given to the patient.  *** minutes face to face time of which over 50% was spent in counseling.

## 2021-01-06 ENCOUNTER — Other Ambulatory Visit: Payer: Self-pay

## 2021-01-06 ENCOUNTER — Other Ambulatory Visit (HOSPITAL_COMMUNITY)
Admission: RE | Admit: 2021-01-06 | Discharge: 2021-01-06 | Disposition: A | Discharge status: Home / Self Care | Payer: Federal, State, Local not specified - PPO | Source: Ambulatory Visit | Attending: Obstetrics and Gynecology | Admitting: Obstetrics and Gynecology

## 2021-01-06 ENCOUNTER — Ambulatory Visit: Payer: Federal, State, Local not specified - PPO | Admitting: Obstetrics and Gynecology

## 2021-01-06 ENCOUNTER — Encounter: Payer: Self-pay | Admitting: Obstetrics and Gynecology

## 2021-01-06 ENCOUNTER — Ambulatory Visit (INDEPENDENT_AMBULATORY_CARE_PROVIDER_SITE_OTHER): Payer: Federal, State, Local not specified - PPO | Admitting: Obstetrics and Gynecology

## 2021-01-06 VITALS — BP 116/78 | HR 80 | Resp 16 | Wt 247.0 lb

## 2021-01-06 DIAGNOSIS — Z124 Encounter for screening for malignant neoplasm of cervix: Secondary | ICD-10-CM | POA: Insufficient documentation

## 2021-01-06 DIAGNOSIS — N923 Ovulation bleeding: Secondary | ICD-10-CM | POA: Insufficient documentation

## 2021-01-06 DIAGNOSIS — Z0289 Encounter for other administrative examinations: Secondary | ICD-10-CM

## 2021-01-06 DIAGNOSIS — Z113 Encounter for screening for infections with a predominantly sexual mode of transmission: Secondary | ICD-10-CM | POA: Diagnosis not present

## 2021-01-06 NOTE — Progress Notes (Signed)
GYNECOLOGY  VISIT   HPI: 35 y.o.   Married Black or [place] American Not Hispanic or Latino  female   G1P1001 with Patient's last menstrual period was 12/21/2020.   here for spotting. The patient was evaluated in 5/22 for intermenstrual bleeding and pelvic pain. She was c/o intermenstrual bleeding intermittently since January. Normal ultrasound.  She had negative STD testing in 4/22.  Last pap was in 1/21: negative, negative hpv  No spotting from 4/21-8/21. She started spotting on 8/30 (mid cycle), didn't spot yesterday, spotting again now. Really worries her.  No vaginitis symptoms.   She had sex with someone other than her husband 12/11/20, no condoms. Wants STD testing.   GYNECOLOGIC HISTORY: Patient's last menstrual period was 12/21/2020. Contraception:btl Menopausal hormone therapy: none        OB History     Gravida  1   Para  1   Term  1   Preterm      AB      Living  1      SAB      IAB      Ectopic      Multiple      Live Births                 There are no problems to display for this patient.   Past Medical History:  Diagnosis Date   Genital herpes    STD (sexually transmitted disease)    chlamydia    Past Surgical History:  Procedure Laterality Date   TUBAL LIGATION      Current Outpatient Medications  Medication Sig Dispense Refill   citalopram (CELEXA) 20 MG tablet TAKE 1 TABLET BY MOUTH EVERY DAY 30 tablet 12   Omega-3 Fatty Acids (FISH OIL PO) Take by mouth.     triamcinolone cream (KENALOG) 0.1 % Use twice daily x 2 weeks, then twice weekly 30 g 0   No current facility-administered medications for this visit.     ALLERGIES: Patient has no known allergies.  Family History  Problem Relation Age of Onset   Diabetes Paternal Grandfather    Hypertension Paternal Grandfather     Social History   Socioeconomic History   Marital status: Married    Spouse name: Not on file   Number of children: Not on file   Years of  education: Not on file   Highest education level: Not on file  Occupational History   Not on file  Tobacco Use   Smoking status: Every Day    Types: Cigarettes   Smokeless tobacco: Never  Vaping Use   Vaping Use: Never used  Substance and Sexual Activity   Alcohol use: Yes    Comment: a pint a day   Drug use: Yes    Types: Marijuana   Sexual activity: Yes    Birth control/protection: Surgical    Comment: BTL   Other Topics Concern   Not on file  Social History Narrative   Not on file   Social Determinants of Health   Financial Resource Strain: Not on file  Food Insecurity: Not on file  Transportation Needs: Not on file  Physical Activity: Not on file  Stress: Not on file  Social Connections: Not on file  Intimate Partner Violence: Not on file    Review of Systems  Constitutional: Negative.   HENT: Negative.    Eyes: Negative.   Respiratory: Negative.    Cardiovascular: Negative.   Gastrointestinal: Negative.   Genitourinary:  Spotting  Musculoskeletal: Negative.   Skin: Negative.   Neurological: Negative.   Endo/Heme/Allergies: Negative.   Psychiatric/Behavioral: Negative.     PHYSICAL EXAMINATION:    BP 116/78   Pulse 80   Resp 16   Wt 247 lb (112 kg)   LMP 12/21/2020   BMI 38.40 kg/m     General appearance: alert, cooperative and appears stated age  Pelvic: External genitalia:  no lesions              Urethra:  normal appearing urethra with no masses, tenderness or lesions              Bartholins and Skenes: normal                 Vagina: normal appearing vagina with normal color and discharge, no lesions, small amount of blood in the vagina.               Cervix: no lesions and no ectropion, not friable              Bimanual Exam:  Uterus:  normal size, contour, position, consistency, mobility, non-tender              Adnexa: no mass, fullness, tenderness               Chaperone was present for exam.  1. Intermenstrual bleeding Normal  ultrasound. Occurring midcycle, think it is physiologic occurring with ovulation. - Cytology - PAP - SURESWAB CT/NG/T. Vaginalis -Discussed the option of OCP's  2. Screening for cervical cancer - Cytology - PAP  3. Screening examination for STD (sexually transmitted disease) - Cytology - PAP - SURESWAB CT/NG/T. Vaginalis -Declines blood work

## 2021-01-07 LAB — CYTOLOGY - PAP: Diagnosis: NEGATIVE

## 2021-01-07 LAB — SURESWAB CT/NG/T. VAGINALIS
C. trachomatis RNA, TMA: NOT DETECTED
N. gonorrhoeae RNA, TMA: NOT DETECTED
Trichomonas vaginalis RNA: NOT DETECTED

## 2021-01-18 ENCOUNTER — Encounter: Payer: Self-pay | Admitting: Obstetrics and Gynecology

## 2021-01-19 NOTE — Telephone Encounter (Signed)
Patient called back today and said she does have PCP and spoke with them about handling her FMLA but they will need to see her for in person appt and cannot see her for a few weeks. She was following up on My Chart message from yesterday to see if Dr. Oscar La will handle it.

## 2021-01-24 DIAGNOSIS — H04123 Dry eye syndrome of bilateral lacrimal glands: Secondary | ICD-10-CM | POA: Diagnosis not present

## 2021-01-24 DIAGNOSIS — H40033 Anatomical narrow angle, bilateral: Secondary | ICD-10-CM | POA: Diagnosis not present

## 2021-01-25 IMAGING — RF DG BE SINGLE CONTRAST
1 series · 14 of 14 positions shown · non-contrast
Comparison: None.

CLINICAL DATA: Irritable bowel syndrome.

EXAM:
SINGLE COLUMN BARIUM ENEMA
TECHNIQUE: Initial scout AP supine abdominal image obtained to insure adequate
colon cleansing. Barium was introduced into the colon in a
retrograde fashion and refluxed from the rectum to the cecum. Spot
images of the colon followed by overhead radiographs were obtained.
FLUOROSCOPY TIME:  Fluoroscopy Time:  1 minutes 42 seconds
Radiation Exposure Index (if provided by the fluoroscopic device):
1,692 mGy

[Series 1: one shot · 0.14mm/px · 14 of 14 slices shown]
[im 1/14]
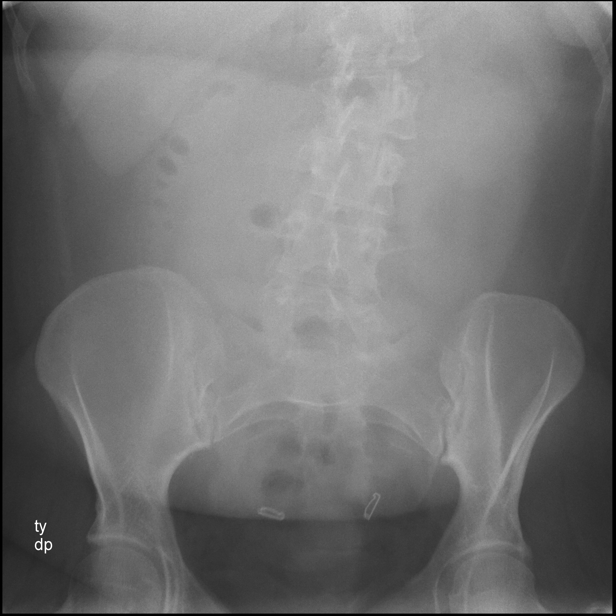
[im 2/14]
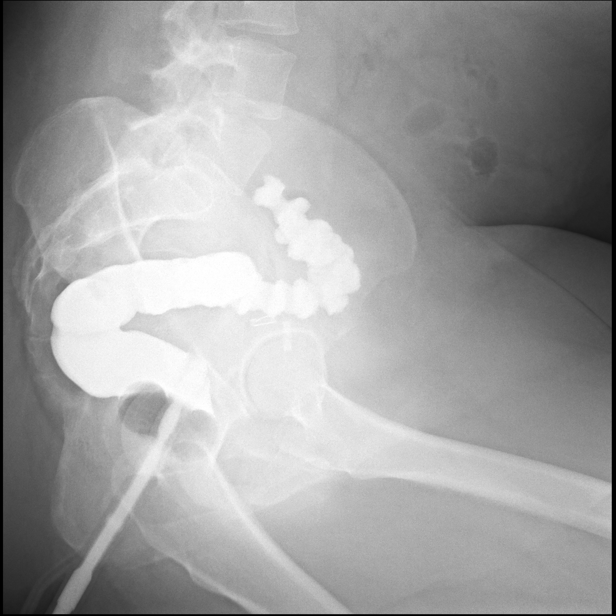
[im 3/14]
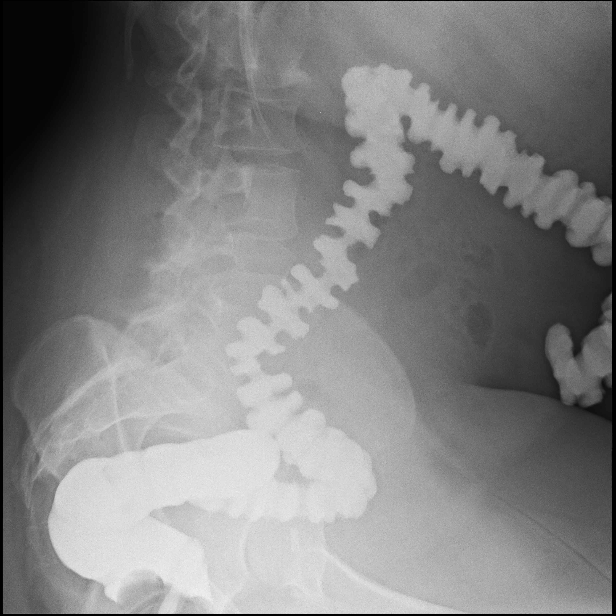
[im 4/14]
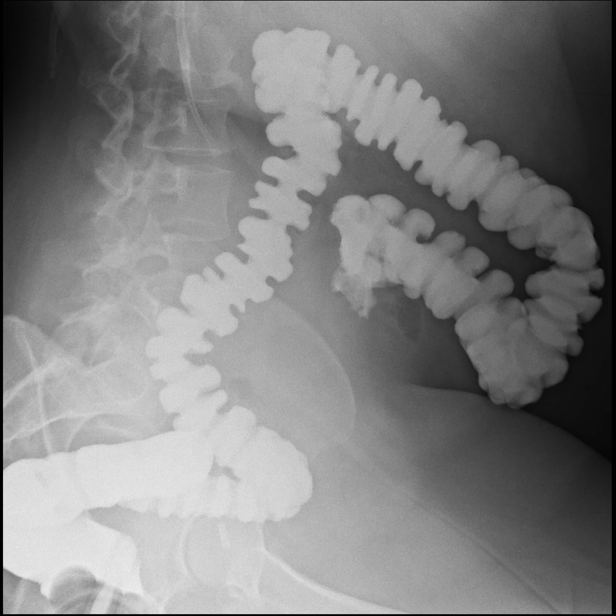
[im 5/14]
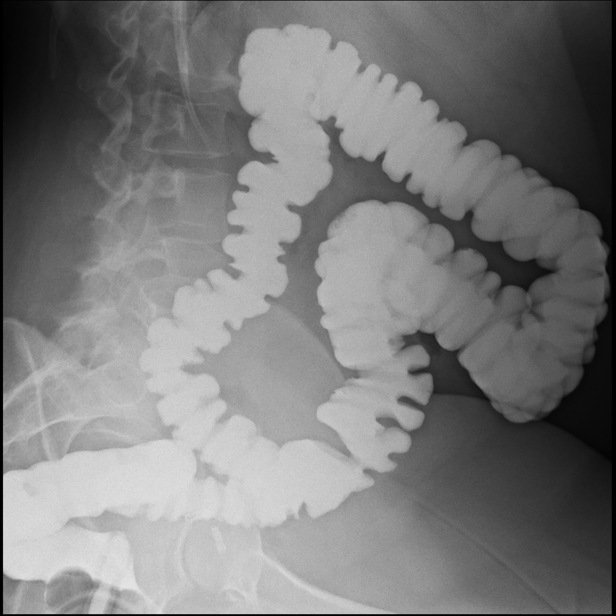
[im 6/14]
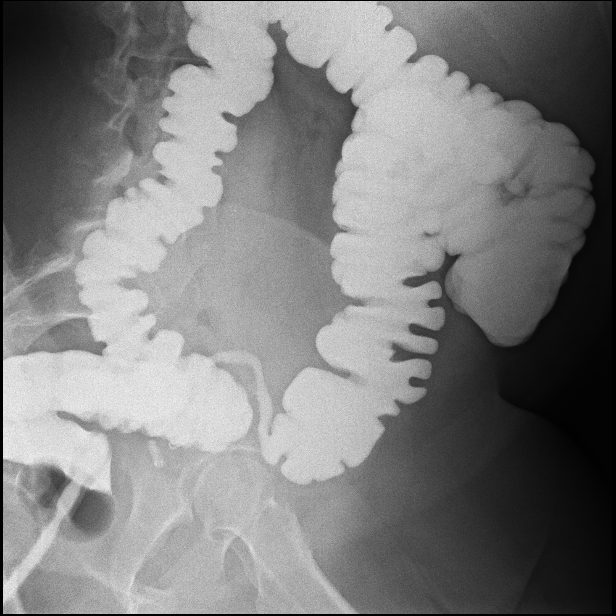
[im 7/14]
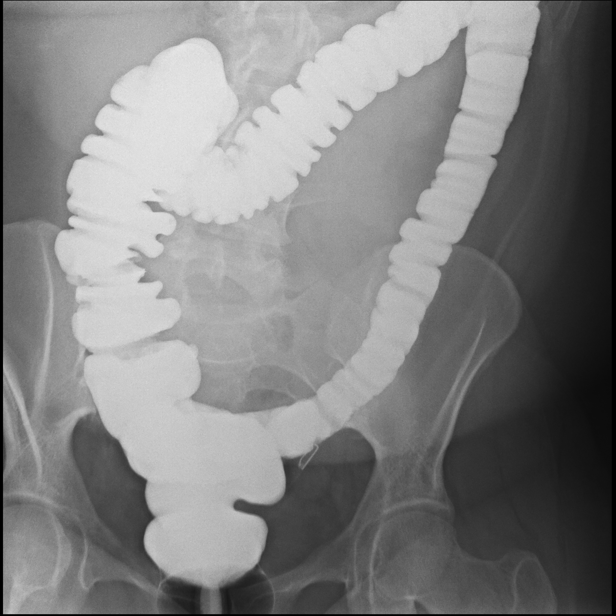
[im 8/14]
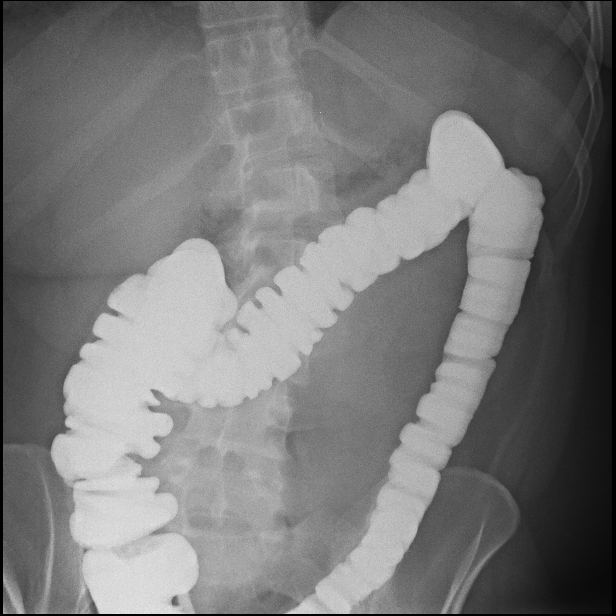
[im 9/14]
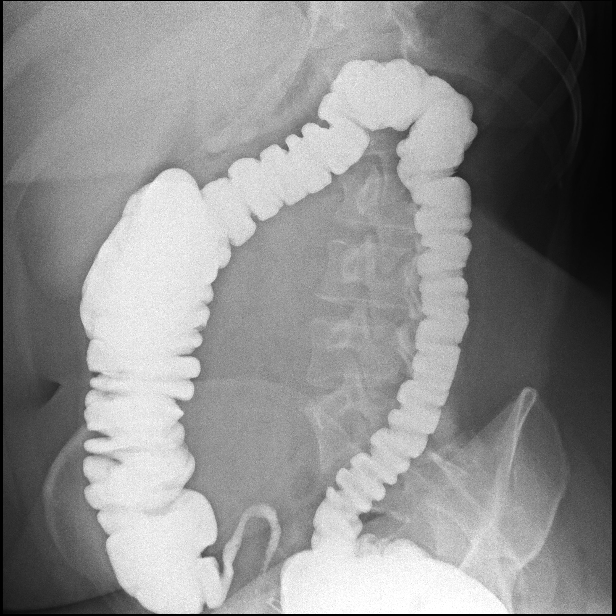
[im 10/14]
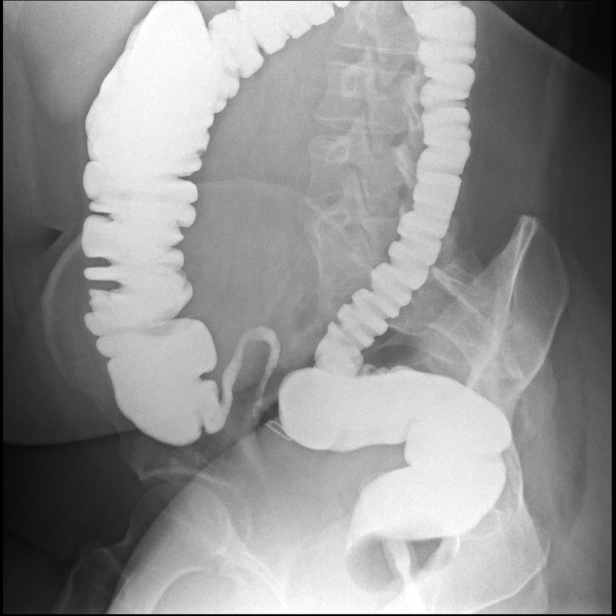
[im 11/14]
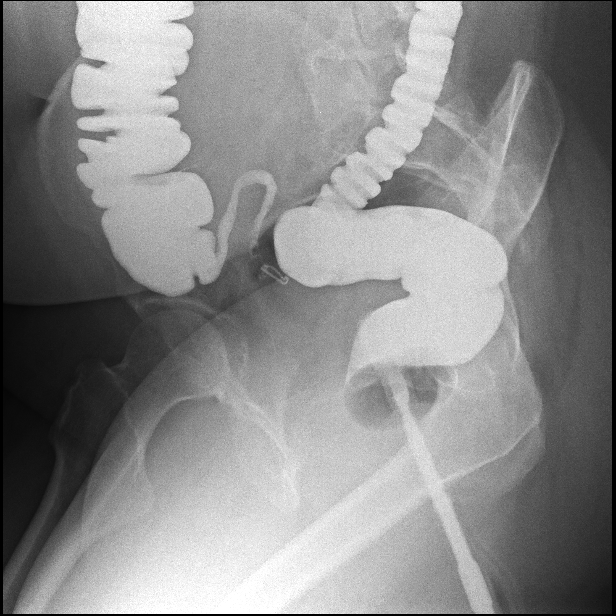
[im 12/14]
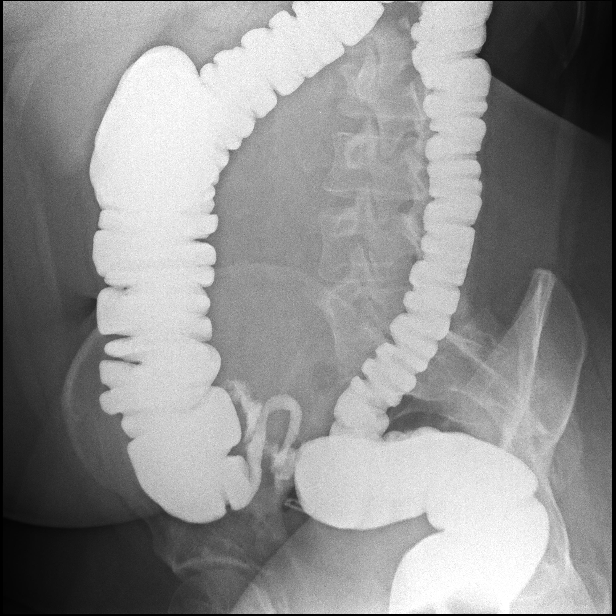
[im 13/14]
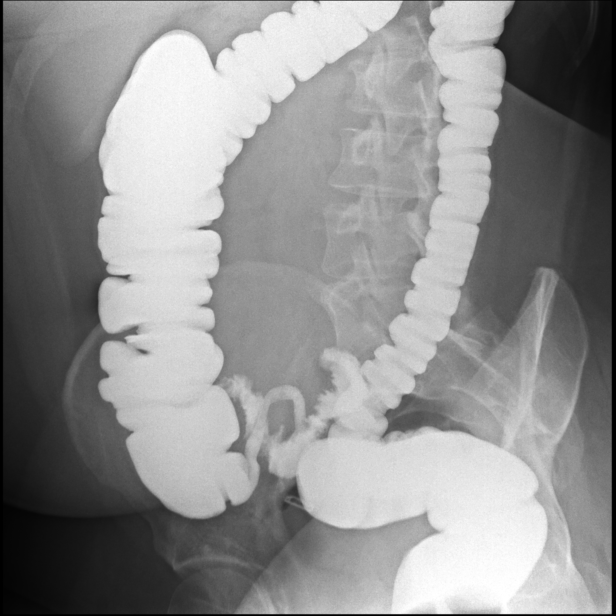
[im 14/14]
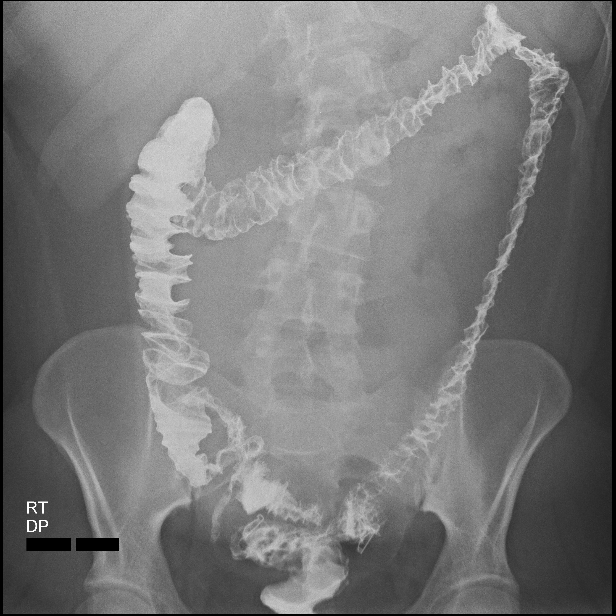

[14 of 14 positions shown; findings below may reference images not displayed]

FINDINGS: The scout KUB is normal. Tubal ligation clips are noted.

The entire colon was filled in the normal retrograde manner. There
are no mass lesions, polyps, or strictures. The appendix fills and
appears normal. There was reflux into the normal appearing terminal
ileum.

The post evacuation image demonstrates excellent emptying of the
colon with some residual contrast in the colon and terminal ileum
and appendix.
IMPRESSION: Normal barium enema including the terminal ileum and appendix.

## 2021-03-25 DIAGNOSIS — Z111 Encounter for screening for respiratory tuberculosis: Secondary | ICD-10-CM | POA: Diagnosis not present

## 2021-03-27 DIAGNOSIS — Z111 Encounter for screening for respiratory tuberculosis: Secondary | ICD-10-CM | POA: Diagnosis not present

## 2021-04-02 ENCOUNTER — Encounter: Payer: Self-pay | Admitting: Obstetrics and Gynecology

## 2021-06-14 DIAGNOSIS — Z111 Encounter for screening for respiratory tuberculosis: Secondary | ICD-10-CM | POA: Diagnosis not present

## 2021-06-17 DIAGNOSIS — Z111 Encounter for screening for respiratory tuberculosis: Secondary | ICD-10-CM | POA: Diagnosis not present

## 2021-06-24 DIAGNOSIS — Z111 Encounter for screening for respiratory tuberculosis: Secondary | ICD-10-CM | POA: Diagnosis not present

## 2021-06-27 DIAGNOSIS — Z111 Encounter for screening for respiratory tuberculosis: Secondary | ICD-10-CM | POA: Diagnosis not present

## 2021-10-20 NOTE — Progress Notes (Deleted)
36 y.o. G79P1001 Married Black or [place] American Not Hispanic or Latino female here for annual exam.      No LMP recorded.          Sexually active: {yes no:314532}  The current method of family planning is {contraception:315051}.    Exercising: {yes no:314532}  {types:19826} Smoker:  no  Health Maintenance: Pap:  01-06-21 normal History of abnormal Pap:  no MMG:  N/A BMD:   N/A Colonoscopy: N/A TDaP:  2019 Gardasil: Completed per pt   reports that she has been smoking cigarettes. She has never used smokeless tobacco. She reports current alcohol use. She reports current drug use. Drug: Marijuana.  Past Medical History:  Diagnosis Date   Genital herpes    STD (sexually transmitted disease)    chlamydia    Past Surgical History:  Procedure Laterality Date   TUBAL LIGATION      Current Outpatient Medications  Medication Sig Dispense Refill   citalopram (CELEXA) 20 MG tablet TAKE 1 TABLET BY MOUTH EVERY DAY 30 tablet 12   Omega-3 Fatty Acids (FISH OIL PO) Take by mouth.     triamcinolone cream (KENALOG) 0.1 % Use twice daily x 2 weeks, then twice weekly 30 g 0   No current facility-administered medications for this visit.    Family History  Problem Relation Age of Onset   Diabetes Paternal Grandfather    Hypertension Paternal Grandfather     Review of Systems  Exam:   There were no vitals taken for this visit.  Weight change: @WEIGHTCHANGE @ Height:      Ht Readings from Last 3 Encounters:  09/30/20 5' 7.25" (1.708 m)  08/23/20 5' 7.25" (1.708 m)  03/02/20 5\' 7"  (1.702 m)    General appearance: alert, cooperative and appears stated age Head: Normocephalic, without obvious abnormality, atraumatic Neck: no adenopathy, supple, symmetrical, trachea midline and thyroid {CHL AMB PHY EX THYROID NORM DEFAULT:[phone]::"normal to inspection and palpation"} Lungs: clear to auscultation bilaterally Cardiovascular: regular rate and rhythm Breasts: {Exam;  breast:13139::"normal appearance, no masses or tenderness"} Abdomen: soft, non-tender; non distended,  no masses,  no organomegaly Extremities: extremities normal, atraumatic, no cyanosis or edema Skin: Skin color, texture, turgor normal. No rashes or lesions Lymph nodes: Cervical, supraclavicular, and axillary nodes normal. No abnormal inguinal nodes palpated Neurologic: Grossly normal   Pelvic: External genitalia:  no lesions              Urethra:  normal appearing urethra with no masses, tenderness or lesions              Bartholins and Skenes: normal                 Vagina: normal appearing vagina with normal color and discharge, no lesions              Cervix: {CHL AMB PHY EX CERVIX NORM DEFAULT:[id number]::"no lesions"}               Bimanual Exam:  Uterus:  {CHL AMB PHY EX UTERUS NORM [phone]::"normal size, contour, position, consistency, mobility, non-tender"}              Adnexa: {CHL AMB PHY EX ADNEXA NO MASS [phone]::"no mass, fullness, tenderness"}               Rectovaginal: Confirms               Anus:  normal sphincter tone, no lesions  *** chaperoned for the exam.  A:  Well Woman  with normal exam  P:

## 2021-11-03 ENCOUNTER — Ambulatory Visit: Payer: Federal, State, Local not specified - PPO | Admitting: Obstetrics and Gynecology

## 2021-12-12 ENCOUNTER — Encounter: Payer: Self-pay | Admitting: Obstetrics and Gynecology

## 2021-12-12 MED ORDER — FLUCONAZOLE 150 MG PO TABS: 150.0000 mg | ORAL_TABLET | Freq: Once | ORAL | 0 refills | Status: AC

## 2021-12-12 NOTE — Telephone Encounter (Signed)
Please send in one diflucan 150 mg tablet for her. If she isn't better after this she should be seen.

## 2021-12-26 NOTE — Progress Notes (Signed)
36 y.o. G26P1001 Married Black or [place] American Not Hispanic or Latino female here for annual exam.  Just found out her husband has been with hookers.  Period Cycle (Days): 28 Period Duration (Days): 5 Period Pattern: Regular Menstrual Flow: Moderate Menstrual Control: Tampon Menstrual Control Change Freq (Hours): 3 Dysmenorrhea: None  H/O HSV, infrequent outbreaks.   She c/o an increase in thin, white vaginal d/c  Wants to restart Celexa.   Patient's last menstrual period was 12/31/2021.          Sexually active: Yes.    The current method of family planning is tubal ligation.    Exercising: No.  The patient does not participate in regular exercise at present. Smoker:  yes 1/2 pack a day   Health Maintenance: Pap: 01/06/21 WNL;  05-19-2019 neg HPV HR neg History of abnormal Pap:  no MMG:  n/a BMD:   n/a Colonoscopy: n/a TDaP:  07/18/17 Gardasil: complete per patient    reports that she has been smoking cigarettes. She has never used smokeless tobacco. She reports current alcohol use. She reports current drug use. Drug: Marijuana. Drinking 2 drinks several days a week. Working as a travel Lawyer. Son is 15, in 10 th grade.   Past Medical History:  Diagnosis Date   Genital herpes    STD (sexually transmitted disease)    chlamydia    Past Surgical History:  Procedure Laterality Date   TUBAL LIGATION      Current Outpatient Medications  Medication Sig Dispense Refill   Omega-3 Fatty Acids (FISH OIL PO) Take by mouth.     triamcinolone cream (KENALOG) 0.1 % Use twice daily x 2 weeks, then twice weekly 30 g 0   citalopram (CELEXA) 20 MG tablet TAKE 1 TABLET BY MOUTH EVERY DAY (Patient not taking: Reported on 01/03/2022) 30 tablet 12   No current facility-administered medications for this visit.    Family History  Problem Relation Age of Onset   Diabetes Paternal Grandfather    Hypertension Paternal Grandfather     Review of Systems  All other systems reviewed and are  negative.   Exam:   BP 132/82   Pulse 79   Ht 5\' 7"  (1.702 m)   Wt 237 lb (107.5 kg)   LMP 12/31/2021   SpO2 100%   BMI 37.12 kg/m   Weight change: @WEIGHTCHANGE @ Height:   Height: 5\' 7"  (170.2 cm)  Ht Readings from Last 3 Encounters:  01/03/22 5\' 7"  (1.702 m)  09/30/20 5' 7.25" (1.708 m)  08/23/20 5' 7.25" (1.708 m)    General appearance: alert, cooperative and appears stated age Head: Normocephalic, without obvious abnormality, atraumatic Neck: no adenopathy, supple, symmetrical, trachea midline and thyroid normal to inspection and palpation Lungs: clear to auscultation bilaterally Cardiovascular: regular rate and rhythm Breasts: normal appearance, no masses or tenderness Abdomen: soft, non-tender; non distended,  no masses,  no organomegaly Extremities: extremities normal, atraumatic, no cyanosis or edema Skin: Skin color, texture, turgor normal. No rashes or lesions Lymph nodes: Cervical, supraclavicular, and axillary nodes normal. No abnormal inguinal nodes palpated Neurologic: Grossly normal   Pelvic: External genitalia:  no lesions              Urethra:  normal appearing urethra with no masses, tenderness or lesions              Bartholins and Skenes: normal                 Vagina: normal appearing  vagina with an increase in thin, white vaginal d/c with odor              Cervix: no cervical motion tenderness and no lesions               Bimanual Exam:  Uterus:   no masses or tenderness              Adnexa: no mass, fullness, tenderness               Rectovaginal: Confirms               Anus:  normal sphincter tone, no lesions  Carolynn Serve chaperoned for the exam.  1. Well woman exam No pap  Discussed breast self exam Discussed calcium and vit D intake  2. Screening examination for STD (sexually transmitted disease) - RPR - HIV Antibody (routine testing w rflx) - Hepatitis C antibody - SURESWAB CT/NG/T. vaginalis  3. Depression with anxiety Names of  therapists given. Will restart Celexa - citalopram (CELEXA) 20 MG tablet; TAKE 1/2 a TABLET po  a qd for one week, if tolerating then increase to one tablet a day.  Dispense: 90 tablet; Refill: 3  4. History of herpes genitalis - valACYclovir (VALTREX) 500 MG tablet; Take one tablet po BID x 3 days prn  Dispense: 30 tablet; Refill: 1  5. Family history of diabetes mellitus (DM) - Hemoglobin A1c  6. BMI 37.0-37.9, adult - Lipid panel - Comprehensive metabolic panel - Hemoglobin A1c  7. Screening, lipid - Lipid panel  8. Laboratory exam ordered as part of routine general medical examination - Comprehensive metabolic panel  9. Vaginal discharge - WET PREP FOR TRICH, YEAST, CLUE  10. BV (bacterial vaginosis) - metroNIDAZOLE (METROGEL) 0.75 % vaginal gel; Place 1 Applicatorful vaginally at bedtime. Use x 5 nights.  Dispense: 70 g; Refill: 0

## 2022-01-03 ENCOUNTER — Ambulatory Visit (INDEPENDENT_AMBULATORY_CARE_PROVIDER_SITE_OTHER): Payer: Federal, State, Local not specified - PPO | Admitting: Obstetrics and Gynecology

## 2022-01-03 ENCOUNTER — Encounter: Payer: Self-pay | Admitting: Obstetrics and Gynecology

## 2022-01-03 VITALS — BP 132/82 | HR 79 | Ht 67.0 in | Wt 237.0 lb

## 2022-01-03 DIAGNOSIS — F418 Other specified anxiety disorders: Secondary | ICD-10-CM | POA: Diagnosis not present

## 2022-01-03 DIAGNOSIS — Z6837 Body mass index (BMI) 37.0-37.9, adult: Secondary | ICD-10-CM

## 2022-01-03 DIAGNOSIS — Z113 Encounter for screening for infections with a predominantly sexual mode of transmission: Secondary | ICD-10-CM

## 2022-01-03 DIAGNOSIS — Z8619 Personal history of other infectious and parasitic diseases: Secondary | ICD-10-CM | POA: Diagnosis not present

## 2022-01-03 DIAGNOSIS — N760 Acute vaginitis: Secondary | ICD-10-CM

## 2022-01-03 DIAGNOSIS — Z0000 Encounter for general adult medical examination without abnormal findings: Secondary | ICD-10-CM | POA: Diagnosis not present

## 2022-01-03 DIAGNOSIS — Z833 Family history of diabetes mellitus: Secondary | ICD-10-CM

## 2022-01-03 DIAGNOSIS — Z01419 Encounter for gynecological examination (general) (routine) without abnormal findings: Secondary | ICD-10-CM

## 2022-01-03 DIAGNOSIS — N898 Other specified noninflammatory disorders of vagina: Secondary | ICD-10-CM | POA: Diagnosis not present

## 2022-01-03 DIAGNOSIS — B9689 Other specified bacterial agents as the cause of diseases classified elsewhere: Secondary | ICD-10-CM

## 2022-01-03 DIAGNOSIS — Z13220 Encounter for screening for lipoid disorders: Secondary | ICD-10-CM

## 2022-01-03 LAB — WET PREP FOR TRICH, YEAST, CLUE

## 2022-01-03 MED ORDER — METRONIDAZOLE 0.75 % VA GEL: 1.0000 | Freq: Every day | VAGINAL | 0 refills | Status: AC

## 2022-01-03 MED ORDER — VALACYCLOVIR HCL 500 MG PO TABS: ORAL_TABLET | ORAL | 1 refills | Status: AC

## 2022-01-03 MED ORDER — CITALOPRAM HYDROBROMIDE 20 MG PO TABS: ORAL_TABLET | ORAL | 3 refills | Status: AC

## 2022-01-03 NOTE — Patient Instructions (Signed)

## 2022-01-04 LAB — HEMOGLOBIN A1C
Hgb A1c MFr Bld: 4.8 % of total Hgb (ref <5.7)
Mean Plasma Glucose: 91 mg/dL
eAG (mmol/L): 5 mmol/L

## 2022-01-04 LAB — LIPID PANEL
Cholesterol: 222 mg/dL — ABNORMAL HIGH (ref <200)
HDL: 58 mg/dL (ref >=50)
LDL Cholesterol (Calc): 139 mg/dL (calc) — ABNORMAL HIGH
Non-HDL Cholesterol (Calc): 164 mg/dL (calc) — ABNORMAL HIGH (ref <130)
Total CHOL/HDL Ratio: 3.8 (calc) (ref <5.0)
Triglycerides: 130 mg/dL (ref <150)

## 2022-01-04 LAB — COMPREHENSIVE METABOLIC PANEL
AG Ratio: 1.6 (calc) (ref 1.0–2.5)
ALT: 49 U/L — ABNORMAL HIGH (ref 6–29)
AST: 54 U/L — ABNORMAL HIGH (ref 10–30)
Albumin: 4.7 g/dL (ref 3.6–5.1)
Alkaline phosphatase (APISO): 92 U/L (ref 31–125)
BUN: 8 mg/dL (ref 7–25)
CO2: 27 mmol/L (ref 20–32)
Calcium: 10.6 mg/dL — ABNORMAL HIGH (ref 8.6–10.2)
Chloride: 102 mmol/L (ref 98–110)
Creat: 0.92 mg/dL (ref 0.50–0.97)
Globulin: 2.9 g/dL (calc) (ref 1.9–3.7)
Glucose, Bld: 85 mg/dL (ref 65–99)
Potassium: 4.2 mmol/L (ref 3.5–5.3)
Sodium: 142 mmol/L (ref 135–146)
Total Bilirubin: 0.9 mg/dL (ref 0.2–1.2)
Total Protein: 7.6 g/dL (ref 6.1–8.1)

## 2022-01-04 LAB — SURESWAB CT/NG/T. VAGINALIS
C. trachomatis RNA, TMA: NOT DETECTED
N. gonorrhoeae RNA, TMA: NOT DETECTED
Trichomonas vaginalis RNA: NOT DETECTED

## 2022-01-04 LAB — RPR: RPR Ser Ql: NONREACTIVE

## 2022-01-04 LAB — HIV ANTIBODY (ROUTINE TESTING W REFLEX): HIV 1&2 Ab, 4th Generation: NONREACTIVE

## 2022-01-04 LAB — HEPATITIS C ANTIBODY: Hepatitis C Ab: NONREACTIVE

## 2022-01-05 ENCOUNTER — Other Ambulatory Visit: Payer: Self-pay | Admitting: Obstetrics and Gynecology

## 2022-01-05 DIAGNOSIS — R748 Abnormal levels of other serum enzymes: Secondary | ICD-10-CM

## 2022-01-07 ENCOUNTER — Emergency Department (HOSPITAL_COMMUNITY)
Admission: EM | Admit: 2022-01-07 | Discharge: 2022-01-07 | Disposition: A | Discharge status: Home / Self Care | Payer: Federal, State, Local not specified - PPO | Attending: Emergency Medicine | Admitting: Emergency Medicine

## 2022-01-07 ENCOUNTER — Other Ambulatory Visit: Payer: Self-pay

## 2022-01-07 ENCOUNTER — Encounter (HOSPITAL_COMMUNITY): Payer: Self-pay | Admitting: Emergency Medicine

## 2022-01-07 DIAGNOSIS — T50902A Poisoning by unspecified drugs, medicaments and biological substances, intentional self-harm, initial encounter: Secondary | ICD-10-CM | POA: Diagnosis not present

## 2022-01-07 DIAGNOSIS — T887XXA Unspecified adverse effect of drug or medicament, initial encounter: Secondary | ICD-10-CM | POA: Diagnosis not present

## 2022-01-07 DIAGNOSIS — F10129 Alcohol abuse with intoxication, unspecified: Secondary | ICD-10-CM

## 2022-01-07 DIAGNOSIS — T50904A Poisoning by unspecified drugs, medicaments and biological substances, undetermined, initial encounter: Secondary | ICD-10-CM | POA: Diagnosis not present

## 2022-01-07 DIAGNOSIS — I10 Essential (primary) hypertension: Secondary | ICD-10-CM | POA: Diagnosis not present

## 2022-01-07 DIAGNOSIS — R000 Tachycardia, unspecified: Secondary | ICD-10-CM | POA: Diagnosis not present

## 2022-01-07 DIAGNOSIS — T39012A Poisoning by aspirin, intentional self-harm, initial encounter: Secondary | ICD-10-CM | POA: Diagnosis not present

## 2022-01-07 LAB — I-STAT BETA HCG BLOOD, ED (MC, WL, AP ONLY): I-stat hCG, quantitative: <5 m[IU]/mL (ref <5)

## 2022-01-07 LAB — CBC WITH DIFFERENTIAL/PLATELET
Abs Immature Granulocytes: 0.03 10*3/uL (ref 0.00–0.07)
Basophils Absolute: 0 10*3/uL (ref 0.0–0.1)
Basophils Relative: 1 %
Eosinophils Absolute: 0.2 10*3/uL (ref 0.0–0.5)
Eosinophils Relative: 3 %
HCT: 38.3 % (ref 36.0–46.0)
Hemoglobin: 12.9 g/dL (ref 12.0–15.0)
Immature Granulocytes: 0 %
Lymphocytes Relative: 32 %
Lymphs Abs: 2.2 10*3/uL (ref 0.7–4.0)
MCH: 30.6 pg (ref 26.0–34.0)
MCHC: 33.7 g/dL (ref 30.0–36.0)
MCV: 90.8 fL (ref 80.0–100.0)
Monocytes Absolute: 0.4 10*3/uL (ref 0.1–1.0)
Monocytes Relative: 5 %
Neutro Abs: 4 10*3/uL (ref 1.7–7.7)
Neutrophils Relative %: 59 %
Platelets: 310 10*3/uL (ref 150–400)
RBC: 4.22 MIL/uL (ref 3.87–5.11)
RDW: 13.5 % (ref 11.5–15.5)
WBC: 6.8 10*3/uL (ref 4.0–10.5)
nRBC: 0 % (ref 0.0–0.2)

## 2022-01-07 LAB — COMPREHENSIVE METABOLIC PANEL
ALT: 50 U/L — ABNORMAL HIGH (ref 0–44)
AST: 36 U/L (ref 15–41)
Albumin: 4.1 g/dL (ref 3.5–5.0)
Alkaline Phosphatase: 74 U/L (ref 38–126)
Anion gap: 12 (ref 5–15)
BUN: 5 mg/dL — ABNORMAL LOW (ref 6–20)
CO2: 27 mmol/L (ref 22–32)
Calcium: 9.3 mg/dL (ref 8.9–10.3)
Chloride: 107 mmol/L (ref 98–111)
Creatinine, Ser: 0.81 mg/dL (ref 0.44–1.00)
GFR, Estimated: >60 mL/min (ref >60)
Glucose, Bld: 92 mg/dL (ref 70–99)
Potassium: 3.4 mmol/L — ABNORMAL LOW (ref 3.5–5.1)
Sodium: 146 mmol/L — ABNORMAL HIGH (ref 135–145)
Total Bilirubin: 0.5 mg/dL (ref 0.3–1.2)
Total Protein: 7.4 g/dL (ref 6.5–8.1)

## 2022-01-07 LAB — ACETAMINOPHEN LEVEL
Acetaminophen (Tylenol), Serum: <10 ug/mL — ABNORMAL LOW (ref 10–30)
Acetaminophen (Tylenol), Serum: <10 ug/mL — ABNORMAL LOW (ref 10–30)

## 2022-01-07 LAB — ETHANOL: Alcohol, Ethyl (B): 184 mg/dL — ABNORMAL HIGH (ref <10)

## 2022-01-07 LAB — SALICYLATE LEVEL
Salicylate Lvl: <7 mg/dL — ABNORMAL LOW (ref 7.0–30.0)
Salicylate Lvl: <7 mg/dL — ABNORMAL LOW (ref 7.0–30.0)

## 2022-01-07 NOTE — ED Notes (Signed)
Patient refusing lab work at this time.

## 2022-01-07 NOTE — Discharge Instructions (Signed)
Schedule follow up with the Counselor you have been referred to

## 2022-01-07 NOTE — ED Provider Notes (Signed)
[facility] EMERGENCY DEPARTMENT Provider Note   CSN: [id number] Arrival date & time: 01/07/22  0435     History  Chief Complaint  Patient presents with   Ingestion    Paula Tyler is a 36 y.o. female.  The history is provided by medical records and the patient.  Ingestion   36 year old female presenting to the ED after intentional overdose on aspirin.  She found out today that her husband cheated on her.  She ingested approx 15 tabs of 81mg  ASA about 1.5 hours PTA. (around 3am). She denies this being an SI attempt, states "I am frustrated and overwhelmed".  She initially denies doing anything like this in the past, then admitted to overdosing as a teenager. She reports she is very frustrated with situation as she has a 73 year old son to raise.  She continues to deny SI/HI here.  States "I aint going to no psych ward".    Home Medications Prior to Admission medications   Medication Sig Start Date End Date Taking? Authorizing Provider  citalopram (CELEXA) 20 MG tablet TAKE 1/2 a TABLET po  a qd for one week, if tolerating then increase to one tablet a day. 01/03/22   01/05/22, MD  metroNIDAZOLE (METROGEL) 0.75 % vaginal gel Place 1 Applicatorful vaginally at bedtime. Use x 5 nights. 01/03/22   01/05/22, MD  Omega-3 Fatty Acids (FISH OIL PO) Take by mouth.    [provider]  triamcinolone cream (KENALOG) 0.1 % Use twice daily x 2 weeks, then twice weekly 08/23/20   08/25/20, NP  valACYclovir (VALTREX) 500 MG tablet Take one tablet po BID x 3 days prn 01/03/22   01/05/22, MD      Allergies    Patient has no known allergies.    Review of Systems   Review of Systems  Psychiatric/Behavioral:  Positive for suicidal ideas.   All other systems reviewed and are negative.   Physical Exam Updated Vital Signs BP (!) 148/106   Pulse (!) 105   Temp 98.1 F (36.7 C) (Oral)   Resp 18   Ht 5\' 7"  (1.702 m)   Wt 107 kg    LMP 12/31/2021   SpO2 100%   BMI 36.95 kg/m   Physical Exam Vitals and nursing note reviewed.  Constitutional:      Appearance: She is well-developed.  HENT:     Head: Normocephalic and atraumatic.  Eyes:     Conjunctiva/sclera: Conjunctivae normal.     Pupils: Pupils are equal, round, and reactive to light.  Cardiovascular:     Rate and Rhythm: Normal rate and regular rhythm.     Heart sounds: Normal heart sounds.  Pulmonary:     Effort: Pulmonary effort is normal. No respiratory distress.     Breath sounds: Normal breath sounds. No rhonchi.  Musculoskeletal:        General: Normal range of motion.     Cervical back: Normal range of motion.  Skin:    General: Skin is warm and dry.  Neurological:     Mental Status: She is alert and oriented to person, place, and time.  Psychiatric:     Comments: Agitated, argumentative with provider and staff, refusing care Denies SI despite OD just PTA Denies HI/AVH     ED Results / Procedures / Treatments   Labs (all labs ordered are listed, but only abnormal results are displayed) Labs Reviewed  COMPREHENSIVE METABOLIC PANEL - Abnormal; Notable for the  following components:      Result Value   Sodium 146 (*)    Potassium 3.4 (*)    BUN 5 (*)    ALT 50 (*)    All other components within normal limits  ETHANOL - Abnormal; Notable for the following components:   Alcohol, Ethyl (B) 184 (*)    All other components within normal limits  SALICYLATE LEVEL - Abnormal; Notable for the following components:   Salicylate Lvl <7.0 (*)    All other components within normal limits  ACETAMINOPHEN LEVEL - Abnormal; Notable for the following components:   Acetaminophen (Tylenol), Serum <10 (*)    All other components within normal limits  CBC WITH DIFFERENTIAL/PLATELET  RAPID URINE DRUG SCREEN, HOSP PERFORMED  ACETAMINOPHEN LEVEL  SALICYLATE LEVEL  I-STAT BETA HCG BLOOD, ED ([facility], [facility], [facility] ONLY)    EKG EKG  Interpretation  Date/Time:  Saturday January 07 2022 04:42:18 EDT Ventricular Rate:  105 PR Interval:  165 QRS Duration: 93 QT Interval:  345 QTC Calculation: 456 R Axis:   11 Text Interpretation: Sinus tachycardia Low voltage, precordial leads No old tracing to compare Confirmed by Melene Plan [phone]) on 01/07/2022 4:43:50 AM  Radiology No results found.  Procedures Procedures    Medications Ordered in ED Medications - No data to display  ED Course/ Medical Decision Making/ A&P                           Medical Decision Making Amount and/or Complexity of Data Reviewed Labs: ordered. ECG/medicine tests: ordered and independent interpretation performed.   36 y.o. F here after OD on 15 tabs of 81mg  ASA (1215mg  total) about 1.5 hours ago (around 3am) after findings out her husband cheated on her.  5:18 AM Patient seen and evaluated.  She is very argumentative with me, not very forthcoming with details, intermittently cursing.  When asked about SI she states "I aint going to no psych ward".  She states she took the pills out of frustration.  She has initially refused blood draw.  She was made aware that given her OD and that she called EMS, she does need medical evaluation.  She remains argumentative about this stating "I am a grown ass woman, you aint making me doing nothing."  Patient made aware that either she will allow blood draw or IVC paperwork will be filed since she is not complying.  She became even more angry about this but finally agreed.  5:48 AM Per poison control-- Q2H salicylate levels until numbers start declining, 4 hour APAP level.  As long as no other symptoms can medically clear after that.  Normal CBC and essentially normal CMP.  Negative tylenol and salicylate levels.  Ethanol is 184.  Question if she actually overdosed or is just intoxicated.  Will still need 4 hour APAP and salicylate levels (due at 7am).  If these are negative I feel she can be medically  cleared to talk with TTS.  Patient has already proven quite difficult while here in the ED, anticipate this will continue.  I have completed IVC paperwork should she become uncooperative and/or try to leave.  Paperwork is with RN who is also aware of care plan.    Care signed out to oncoming provider.  Final Clinical Impression(s) / ED Diagnoses Final diagnoses:  Intentional overdose, initial encounter [facility])    Rx / DC Orders ED Discharge Orders     None  Garlon Hatchet, PA-C 01/07/22 3532    Melene Plan, DO 01/07/22 [phone]

## 2022-01-07 NOTE — ED Notes (Signed)
PATIENT IS DRESS IN A GOWN AND ON MONITOR ,GAVE SOME ICE CHIPS

## 2022-01-07 NOTE — ED Provider Notes (Signed)
Patient requested that I come to her room.  She states that she wants to go home.  Patient reports that she did not take an overdose.  She reports that she does not currently have any thoughts of harming herself or harming anyone else.  Patient reports that she was upset with her husband earlier.  She states that she has calmed down.  Patient denies being suicidal she denies being homicidal.  I spoke to patient's husband he feels that she is safe he states that he does not feel like patient is going to harm anyone or herself.  Patient's alcohol level was 184 initial Tylenol level was less than 10 salicylate was less than 7.  Repeat 4 hour levels are are normal.  Pt more sober.  She is acting and talking treasonably at this time.   I feel pt is safe to go home.  Pt agrees to return if thought of harm to her self or others.  Pt has been given referrals by her GYN for counseling    Osie Cheeks 01/07/22 0851    Gloris Manchester, MD 01/07/22 [phone]

## 2022-01-07 NOTE — ED Notes (Signed)
Patient went to bathroom cup was given to get urine, patient states I don't need one I just went to the GYN  2 days ago, then states after coming out of bathroom when ask if she got the spec. She said it was too late.

## 2022-01-07 NOTE — ED Triage Notes (Addendum)
Patient from home BIB [facility] from home after ingesting 15 81 mg of Aspirin intentionally after discovering that her husband unfortunately cheated on her. When asking patient if she is feeling suicidal or homicidal patient states "I'm feeling frustrated and I have a 35 year old son to raise, so I'm not going to say I'm suicidal or not". Patient cursing upon arrival. Aox4. Denies any complaints of pain at this time.

## 2022-01-19 ENCOUNTER — Telehealth: Payer: Self-pay | Admitting: *Deleted

## 2022-01-19 NOTE — Telephone Encounter (Signed)
Patient called c/o vaginal discharge and vaginal pain. Report she did complete the Metrogel missed days. Reports she take 5 days of Rx and if she still has vaginal discharge and vaginal pain she will schedule an visit. Patient was prescribed Metrogel x5 nights on 01/05/22, I did offer office visit, she declined.

## 2022-06-15 DIAGNOSIS — Z111 Encounter for screening for respiratory tuberculosis: Secondary | ICD-10-CM | POA: Diagnosis not present

## 2022-08-10 ENCOUNTER — Telehealth: Payer: Self-pay

## 2022-08-10 NOTE — Telephone Encounter (Signed)
Pt calling to report vaginal spotting ~1 month now. Denies any vaginitis sxs, pelvic pain, and/or PMS. Just prolonged abnl spotting. Desires to be seen. However, states she tried making appt and reports availability was ~3 weeks out and feels like she should be seen sooner.  In my investigation, I was able to find pt an appt with Dr. Quincy Simmonds on 08/15/2022. Pt accepted appt date/time with BS. I hope this is OK. Will route to provider for final review and close encounter.

## 2022-08-14 NOTE — Progress Notes (Deleted)
GYNECOLOGY  VISIT   HPI: 37 y.o.   Married  [place]  female   G1P1001 with No LMP recorded.   here for   prolonged spotting  GYNECOLOGIC HISTORY: No LMP recorded. Contraception:  tubal ligation Menopausal hormone therapy:  n/a Last mammogram:  n/a Last pap smear:   01/06/21 neg, 05/19/19 neg: HR HPV neg        OB History     Gravida  1   Para  1   Term  1   Preterm      AB      Living  1      SAB      IAB      Ectopic      Multiple      Live Births                 Patient Active Problem List   Diagnosis Date Noted   Vitamin D deficiency 05/05/2015   Tobacco dependence 01/28/2015   History of tubal ligation 11/25/2014   Obesity (BMI 30-39.9) 04/14/2013    Past Medical History:  Diagnosis Date   Genital herpes    STD (sexually transmitted disease)    chlamydia    Past Surgical History:  Procedure Laterality Date   TUBAL LIGATION      Current Outpatient Medications  Medication Sig Dispense Refill   citalopram (CELEXA) 20 MG tablet TAKE 1/2 a TABLET po  a qd for one week, if tolerating then increase to one tablet a day. 90 tablet 3   metroNIDAZOLE (METROGEL) 0.75 % vaginal gel Place 1 Applicatorful vaginally at bedtime. Use x 5 nights. 70 g 0   Omega-3 Fatty Acids (FISH OIL PO) Take by mouth.     triamcinolone cream (KENALOG) 0.1 % Use twice daily x 2 weeks, then twice weekly 30 g 0   valACYclovir (VALTREX) 500 MG tablet Take one tablet po BID x 3 days prn 30 tablet 1   No current facility-administered medications for this visit.     ALLERGIES: Patient has no known allergies.  Family History  Problem Relation Age of Onset   Diabetes Paternal Grandfather    Hypertension Paternal Grandfather     Social History   Socioeconomic History   Marital status: Married    Spouse name: Not on file   Number of children: Not on file   Years of education: Not on file   Highest education level: Not on file  Occupational History   Not on  file  Tobacco Use   Smoking status: Every Day    Types: Cigarettes   Smokeless tobacco: Never  Vaping Use   Vaping Use: Never used  Substance and Sexual Activity   Alcohol use: Yes    Comment: a pint a day   Drug use: Yes    Types: Marijuana   Sexual activity: Yes    Birth control/protection: Surgical    Comment: BTL   Other Topics Concern   Not on file  Social History Narrative   Not on file   Social Determinants of Health   Financial Resource Strain: Not on file  Food Insecurity: Not on file  Transportation Needs: Not on file  Physical Activity: Not on file  Stress: Not on file  Social Connections: Not on file  Intimate Partner Violence: Not on file    Review of Systems  PHYSICAL EXAMINATION:    There were no vitals taken for this visit.    General appearance: alert,  cooperative and appears stated age Head: Normocephalic, without obvious abnormality, atraumatic Neck: no adenopathy, supple, symmetrical, trachea midline and thyroid normal to inspection and palpation Lungs: clear to auscultation bilaterally Breasts: normal appearance, no masses or tenderness, No nipple retraction or dimpling, No nipple discharge or bleeding, No axillary or supraclavicular adenopathy Heart: regular rate and rhythm Abdomen: soft, non-tender, no masses,  no organomegaly Extremities: extremities normal, atraumatic, no cyanosis or edema Skin: Skin color, texture, turgor normal. No rashes or lesions Lymph nodes: Cervical, supraclavicular, and axillary nodes normal. No abnormal inguinal nodes palpated Neurologic: Grossly normal  Pelvic: External genitalia:  no lesions              Urethra:  normal appearing urethra with no masses, tenderness or lesions              Bartholins and Skenes: normal                 Vagina: normal appearing vagina with normal color and discharge, no lesions              Cervix: no lesions                Bimanual Exam:  Uterus:  normal size, contour, position,  consistency, mobility, non-tender              Adnexa: no mass, fullness, tenderness              Rectal exam: {yes no:[id number]}.  Confirms.              Anus:  normal sphincter tone, no lesions  Chaperone was present for exam:  ***  ASSESSMENT     PLAN     An After Visit Summary was printed and given to the patient.  ______ minutes face to face time of which over 50% was spent in counseling.

## 2022-08-15 ENCOUNTER — Ambulatory Visit: Payer: Federal, State, Local not specified - PPO | Admitting: Obstetrics and Gynecology

## 2023-02-21 ENCOUNTER — Ambulatory Visit: Payer: Federal, State, Local not specified - PPO | Admitting: Obstetrics and Gynecology

## 2023-04-14 DIAGNOSIS — Z111 Encounter for screening for respiratory tuberculosis: Secondary | ICD-10-CM | POA: Diagnosis not present

## 2023-04-17 DIAGNOSIS — Z111 Encounter for screening for respiratory tuberculosis: Secondary | ICD-10-CM | POA: Diagnosis not present

## 2023-04-25 DIAGNOSIS — Z111 Encounter for screening for respiratory tuberculosis: Secondary | ICD-10-CM | POA: Diagnosis not present

## 2023-04-27 DIAGNOSIS — Z111 Encounter for screening for respiratory tuberculosis: Secondary | ICD-10-CM | POA: Diagnosis not present

## 2023-07-07 DIAGNOSIS — G44209 Tension-type headache, unspecified, not intractable: Secondary | ICD-10-CM | POA: Diagnosis not present

## 2023-07-07 DIAGNOSIS — R030 Elevated blood-pressure reading, without diagnosis of hypertension: Secondary | ICD-10-CM | POA: Diagnosis not present

## 2023-10-24 ENCOUNTER — Ambulatory Visit: Admitting: Obstetrics and Gynecology

## 2023-11-14 DIAGNOSIS — Z114 Encounter for screening for human immunodeficiency virus [HIV]: Secondary | ICD-10-CM | POA: Diagnosis not present

## 2023-11-14 DIAGNOSIS — Z113 Encounter for screening for infections with a predominantly sexual mode of transmission: Secondary | ICD-10-CM | POA: Diagnosis not present

## 2023-12-22 ENCOUNTER — Emergency Department (HOSPITAL_BASED_OUTPATIENT_CLINIC_OR_DEPARTMENT_OTHER)
Admission: EM | Admit: 2023-12-22 | Discharge: 2023-12-23 | Disposition: A | Discharge status: Home / Self Care | Payer: Self-pay | Attending: Emergency Medicine | Admitting: Emergency Medicine

## 2023-12-22 ENCOUNTER — Other Ambulatory Visit: Payer: Self-pay

## 2023-12-22 ENCOUNTER — Encounter (HOSPITAL_BASED_OUTPATIENT_CLINIC_OR_DEPARTMENT_OTHER): Payer: Self-pay

## 2023-12-22 ENCOUNTER — Emergency Department (HOSPITAL_BASED_OUTPATIENT_CLINIC_OR_DEPARTMENT_OTHER): Payer: Self-pay | Admitting: Radiology

## 2023-12-22 DIAGNOSIS — R0789 Other chest pain: Secondary | ICD-10-CM | POA: Diagnosis not present

## 2023-12-22 DIAGNOSIS — R030 Elevated blood-pressure reading, without diagnosis of hypertension: Secondary | ICD-10-CM | POA: Insufficient documentation

## 2023-12-22 DIAGNOSIS — R079 Chest pain, unspecified: Secondary | ICD-10-CM | POA: Diagnosis not present

## 2023-12-22 DIAGNOSIS — I10 Essential (primary) hypertension: Secondary | ICD-10-CM | POA: Diagnosis not present

## 2023-12-22 LAB — CBC
HCT: 38.1 % (ref 36.0–46.0)
Hemoglobin: 12.7 g/dL (ref 12.0–15.0)
MCH: 31.1 pg (ref 26.0–34.0)
MCHC: 33.3 g/dL (ref 30.0–36.0)
MCV: 93.2 fL (ref 80.0–100.0)
Platelets: 391 K/uL (ref 150–400)
RBC: 4.09 MIL/uL (ref 3.87–5.11)
RDW: 15.2 % (ref 11.5–15.5)
WBC: 7.8 K/uL (ref 4.0–10.5)
nRBC: 0 % (ref 0.0–0.2)

## 2023-12-22 LAB — HCG, SERUM, QUALITATIVE: Preg, Serum: NEGATIVE

## 2023-12-22 LAB — BASIC METABOLIC PANEL WITH GFR
Anion gap: 15 (ref 5–15)
BUN: 7 mg/dL (ref 6–20)
CO2: 22 mmol/L (ref 22–32)
Calcium: 9.6 mg/dL (ref 8.9–10.3)
Chloride: 104 mmol/L (ref 98–111)
Creatinine, Ser: 0.91 mg/dL (ref 0.44–1.00)
GFR, Estimated: >60 mL/min (ref >60)
Glucose, Bld: 95 mg/dL (ref 70–99)
Potassium: 3.5 mmol/L (ref 3.5–5.1)
Sodium: 141 mmol/L (ref 135–145)

## 2023-12-22 LAB — TROPONIN T, HIGH SENSITIVITY
Troponin T High Sensitivity: <15 ng/L (ref 0–19)
Troponin T High Sensitivity: <15 ng/L (ref 0–19)

## 2023-12-22 NOTE — ED Provider Notes (Signed)
 [place] EMERGENCY DEPARTMENT AT [facility]  Provider Note  CSN: [id number] Arrival date & time: 12/22/23 2052  History Chief Complaint  Patient presents with   Hypertension   Chest Pain    Paula Tyler is a 38 y.o. female with no significant PMH reports she has occasional left sided chest pains she has attributed to 'being heavy chested', mostly bothering her when she was laying down. Today while on her way to work she noticed some left sided chest discomfort, no SOB, no radiation to arm or neck. No fever. No leg swelling, no recent travel. She reports her BP was elevated when she got to work and so she came to the ED for evaluation.    Home Medications Prior to Admission medications   Medication Sig Start Date End Date Taking? Authorizing Provider  citalopram  (CELEXA ) 20 MG tablet TAKE 1/2 a TABLET po  a qd for one week, if tolerating then increase to one tablet a day. 01/03/22   Jertson, Jill Evelyn, MD  metroNIDAZOLE  (METROGEL ) 0.75 % vaginal gel Place 1 Applicatorful vaginally at bedtime. Use x 5 nights. 01/03/22   Jertson, Jill Evelyn, MD  Omega-3 Fatty Acids (FISH OIL PO) Take by mouth.    [provider]  triamcinolone  cream (KENALOG ) 0.1 % Use twice daily x 2 weeks, then twice weekly 08/23/20   Melvenia Sor, NP  valACYclovir  (VALTREX ) 500 MG tablet Take one tablet po BID x 3 days prn 01/03/22   Jertson, Jill Evelyn, MD     Allergies    Patient has no known allergies.   Review of Systems   Review of Systems Please see HPI for pertinent positives and negatives  Physical Exam BP (!) 153/109 (BP Location: Right Arm)   Pulse (!) 106   Temp 98 F (36.7 C) (Oral)   Resp 18   LMP 12/07/2023   SpO2 100%   Physical Exam Vitals and nursing note reviewed.  Constitutional:      Appearance: Normal appearance.  HENT:     Head: Normocephalic and atraumatic.     Nose: Nose normal.     Mouth/Throat:     Mouth: Mucous membranes are moist.  Eyes:      Extraocular Movements: Extraocular movements intact.     Conjunctiva/sclera: Conjunctivae normal.  Cardiovascular:     Rate and Rhythm: Normal rate.  Pulmonary:     Effort: Pulmonary effort is normal.     Breath sounds: Normal breath sounds.  Chest:     Chest wall: No tenderness.  Abdominal:     General: Abdomen is flat.     Palpations: Abdomen is soft.     Tenderness: There is no abdominal tenderness.  Musculoskeletal:        General: No swelling. Normal range of motion.     Cervical back: Neck supple.     Right lower leg: No edema.     Left lower leg: No edema.  Skin:    General: Skin is warm and dry.  Neurological:     General: No focal deficit present.     Mental Status: She is alert.  Psychiatric:        Mood and Affect: Mood normal.     ED Results / Procedures / Treatments   EKG EKG Interpretation Date/Time:  Saturday December 22 2023 21:24:53 EDT Ventricular Rate:  96 PR Interval:  164 QRS Duration:  76 QT Interval:  342 QTC Calculation: 432 R Axis:   6  Text Interpretation: Normal  sinus rhythm Anterior infarct , age undetermined Abnormal ECG When compared with ECG of 07-Jan-2022 04:42, PREVIOUS ECG IS PRESENT No significant change since last tracing Confirmed by Roselyn Dunnings [phone]) on 12/22/2023 10:57:18 PM  Procedures Procedures  Medications Ordered in the ED Medications - No data to display  Initial Impression and Plan  Patient with low risk factor for ACS here with nonspecific chest pain. BP is mildly elevated here. Exam is otherwise reassuring. No concern for PE, PTX, PNA. Labs done in triage show normal CBC, BMP and Trop. I personally viewed the images from radiology studies and agree with radiologist interpretation: CXR is clear. Will continue to monitor pending delta trop.   ED Course   Clinical Course as of 12/23/23 0014  Sun Dec 23, 2023  0012 Delta trop remains normal. BP improved without intervention. Recommend she monitor BP at home for the  next week or so. Motrin/APAP as needed for pain. PCP follow up, RTED for any other concerns.   [CS]    Clinical Course User Index [CS] Roselyn Dunnings NOVAK, MD     MDM Rules/Calculators/A&P Medical Decision Making Problems Addressed: Atypical chest pain: acute illness or injury Elevated blood pressure reading: undiagnosed new problem with uncertain prognosis  Amount and/or Complexity of Data Reviewed Labs: ordered. Decision-making details documented in ED Course. Radiology: ordered and independent interpretation performed. Decision-making details documented in ED Course. ECG/medicine tests: ordered and independent interpretation performed. Decision-making details documented in ED Course.  Risk OTC drugs.     Final Clinical Impression(s) / ED Diagnoses Final diagnoses:  Atypical chest pain  Elevated blood pressure reading    Rx / DC Orders ED Discharge Orders     None        Roselyn Dunnings NOVAK, MD 12/23/23 [phone]

## 2023-12-22 NOTE — ED Triage Notes (Signed)
 Pt reports she is here today due to chest pain & hypertension. Pt reports that she was at work and her bp was reading 140's/80/. Pt reports also having central chest pain.

## 2023-12-25 DIAGNOSIS — I10 Essential (primary) hypertension: Secondary | ICD-10-CM | POA: Diagnosis not present

## 2024-01-03 DIAGNOSIS — M419 Scoliosis, unspecified: Secondary | ICD-10-CM | POA: Diagnosis not present

## 2024-01-03 DIAGNOSIS — N62 Hypertrophy of breast: Secondary | ICD-10-CM | POA: Diagnosis not present

## 2024-01-03 DIAGNOSIS — Z0000 Encounter for general adult medical examination without abnormal findings: Secondary | ICD-10-CM | POA: Diagnosis not present

## 2024-01-03 DIAGNOSIS — I10 Essential (primary) hypertension: Secondary | ICD-10-CM | POA: Diagnosis not present

## 2024-02-27 NOTE — Progress Notes (Incomplete)
 38 y.o. G12P1001 female here for annual exam. Married. PCP: Patient, No Pcp Per  No LMP recorded.    She reports ***. Urine sample provided: ***  Abnormal bleeding: *** Pelvic discharge or pain: *** Breast mass, nipple discharge or skin changes : ***  Sexually active: ***  Birth control: *** Last PAP:     Component Value Date/Time   DIAGPAP  01/06/2021 1230    - Negative for intraepithelial lesion or malignancy (NILM)   DIAGPAP  05/19/2019 0858    - Negative for Intraepithelial Lesions or Malignancy (NILM)   DIAGPAP  07/18/2017 0000    NEGATIVE FOR INTRAEPITHELIAL LESIONS OR MALIGNANCY.   DIAGPAP TRICHOMONAS VAGINALIS PRESENT. 07/18/2017 0000   HPVHIGH Negative 05/19/2019 0858   ADEQPAP  01/06/2021 1230    Satisfactory for evaluation; transformation zone component PRESENT.   ADEQPAP  05/19/2019 0858    Satisfactory for evaluation; transformation zone component PRESENT.   ADEQPAP  07/18/2017 0000    Satisfactory for evaluation  endocervical/transformation zone component PRESENT.   Gardasil: ***  Exercising: *** Smoker: ***       GYN HISTORY: ***  OB History  Gravida Para Term Preterm AB Living  1 1 1   1   SAB IAB Ectopic Multiple Live Births          # Outcome Date GA Lbr Len/2nd Weight Sex Type Anes PTL Lv  1 Term            Past Medical History:  Diagnosis Date   Genital herpes    STD (sexually transmitted disease)    chlamydia   Past Surgical History:  Procedure Laterality Date   TUBAL LIGATION     Current Outpatient Medications on File Prior to Visit  Medication Sig Dispense Refill   citalopram  (CELEXA ) 20 MG tablet TAKE 1/2 a TABLET po  a qd for one week, if tolerating then increase to one tablet a day. 90 tablet 3   metroNIDAZOLE  (METROGEL ) 0.75 % vaginal gel Place 1 Applicatorful vaginally at bedtime. Use x 5 nights. 70 g 0   Omega-3 Fatty Acids (FISH OIL PO) Take by mouth.     triamcinolone  cream (KENALOG ) 0.1 % Use twice daily x 2 weeks,  then twice weekly 30 g 0   valACYclovir  (VALTREX ) 500 MG tablet Take one tablet po BID x 3 days prn 30 tablet 1   No current facility-administered medications on file prior to visit.   Social History   Socioeconomic History   Marital status: Married    Spouse name: Not on file   Number of children: Not on file   Years of education: Not on file   Highest education level: Not on file  Occupational History   Not on file  Tobacco Use   Smoking status: Every Day    Types: Cigarettes   Smokeless tobacco: Never  Vaping Use   Vaping status: Never Used  Substance and Sexual Activity   Alcohol use: Yes    Comment: a pint a day   Drug use: Yes    Types: Marijuana   Sexual activity: Yes    Birth control/protection: Surgical    Comment: BTL   Other Topics Concern   Not on file  Social History Narrative   Not on file   Social Drivers of Health   Financial Resource Strain: Not on file  Food Insecurity: Not on file  Transportation Needs: Not on file  Physical Activity: Not on file  Stress: Not on file  Social Connections: Not on file  Intimate Partner Violence: Unknown (10/16/2021)   Received from [facility]   Intimate Partner Violence    Fear of Current or Ex-Partner: Not on file   Family History  Problem Relation Age of Onset   Diabetes Paternal Grandfather    Hypertension Paternal Grandfather    No Known Allergies  PE There were no vitals filed for this visit. There is no height or weight on file to calculate BMI.  Physical Exam    Assessment and Plan:        There are no diagnoses linked to this encounter. Clotilda FORBES Pa, CMA

## 2024-02-28 ENCOUNTER — Ambulatory Visit: Payer: Self-pay | Admitting: Obstetrics and Gynecology

## 2024-04-10 ENCOUNTER — Ambulatory Visit: Admitting: Obstetrics and Gynecology

## 2024-05-25 ENCOUNTER — Emergency Department (HOSPITAL_BASED_OUTPATIENT_CLINIC_OR_DEPARTMENT_OTHER): Admission: EM | Admit: 2024-05-25 | Discharge: 2024-05-25 | Disposition: A | Discharge status: Home / Self Care

## 2024-05-25 ENCOUNTER — Other Ambulatory Visit: Payer: Self-pay

## 2024-05-25 ENCOUNTER — Encounter (HOSPITAL_BASED_OUTPATIENT_CLINIC_OR_DEPARTMENT_OTHER): Payer: Self-pay

## 2024-05-25 DIAGNOSIS — N939 Abnormal uterine and vaginal bleeding, unspecified: Secondary | ICD-10-CM | POA: Insufficient documentation

## 2024-05-25 DIAGNOSIS — N923 Ovulation bleeding: Secondary | ICD-10-CM

## 2024-05-25 DIAGNOSIS — I10 Essential (primary) hypertension: Secondary | ICD-10-CM | POA: Diagnosis not present

## 2024-05-25 LAB — CBC WITH DIFFERENTIAL/PLATELET
Abs Immature Granulocytes: 0.02 K/uL (ref 0.00–0.07)
Basophils Absolute: 0 K/uL (ref 0.0–0.1)
Basophils Relative: 1 %
Eosinophils Absolute: 0.2 K/uL (ref 0.0–0.5)
Eosinophils Relative: 3 %
HCT: 40.1 % (ref 36.0–46.0)
Hemoglobin: 13.4 g/dL (ref 12.0–15.0)
Immature Granulocytes: 0 %
Lymphocytes Relative: 22 %
Lymphs Abs: 1.4 K/uL (ref 0.7–4.0)
MCH: 30.5 pg (ref 26.0–34.0)
MCHC: 33.4 g/dL (ref 30.0–36.0)
MCV: 91.3 fL (ref 80.0–100.0)
Monocytes Absolute: 0.4 K/uL (ref 0.1–1.0)
Monocytes Relative: 6 %
Neutro Abs: 4.6 K/uL (ref 1.7–7.7)
Neutrophils Relative %: 68 %
Platelets: 342 K/uL (ref 150–400)
RBC: 4.39 MIL/uL (ref 3.87–5.11)
RDW: 14.8 % (ref 11.5–15.5)
WBC: 6.7 K/uL (ref 4.0–10.5)
nRBC: 0 % (ref 0.0–0.2)

## 2024-05-25 LAB — BASIC METABOLIC PANEL WITH GFR
Anion gap: 11 (ref 5–15)
BUN: 8 mg/dL (ref 6–20)
CO2: 26 mmol/L (ref 22–32)
Calcium: 9.8 mg/dL (ref 8.9–10.3)
Chloride: 102 mmol/L (ref 98–111)
Creatinine, Ser: 0.73 mg/dL (ref 0.44–1.00)
GFR, Estimated: >60 mL/min
Glucose, Bld: 92 mg/dL (ref 70–99)
Potassium: 3.9 mmol/L (ref 3.5–5.1)
Sodium: 140 mmol/L (ref 135–145)

## 2024-05-25 LAB — PREGNANCY, URINE: Preg Test, Ur: NEGATIVE

## 2024-05-25 NOTE — Discharge Instructions (Addendum)
 You were seen in the ED today for abnormal uterine bleeding between periods. Your evaluation today was reassuring and did not show an emergency cause. Bleeding can occur due to multiple reasons including ovulation, stress, change in hormones, or structural abnormalities. You can take 400 mg ibuprofen every 4-6 hours for pain.  As discussed please follow up with your OBGYN regarding this symptoms and further testing.  Please return to the ED if you experience new or worsening pain, fever or chills, dizziness, fainting, weakness, severe abdominal pain, or new or worsening symptoms.

## 2024-05-25 NOTE — ED Notes (Signed)

## 2024-05-25 NOTE — ED Provider Notes (Signed)
 " [place] EMERGENCY DEPARTMENT AT [facility] Provider Note   CSN: [id number] Arrival date & time: 05/25/24  9172     Patient presents with: Vaginal Bleeding   Paula Tyler is a 39 y.o. female.  Past Medical History:  Diagnosis Date   Genital herpes    STD (sexually transmitted disease)    chlamydia     Vaginal Bleeding  40 year old with past medical history of hypertension presented to the ED complaining of vaginal bleeding.  Patient states her period has been very regular 28 days and she is currently bleeding 12 days prior to expected period.  Patient started spotting yesterday and noticed clots passing today. Stated spotting during ovulation is not abnormal however she does not usually pass clots during period.  Patient unsure how many pads she is soaking per hour as she is not currently using pads or tampons.  Patient's gynecological history is significant for 1 pregnancy 18 years ago and a tubal ligation in 2016 and chlamydia diagnosed and treated over 10 years ago. Patient is sexually active with one partner. Patient denies history of fibroids, polyps, endometriosis.  Though she endorses family history of fibroids.  Denies shortness of breath, chest pain, hx of bleeding disorders, history of fibroids/polyps, abnormal discharge, abnormal odors.    Prior to Admission medications  Medication Sig Start Date End Date Taking? Authorizing Provider  citalopram  (CELEXA ) 20 MG tablet TAKE 1/2 a TABLET po  a qd for one week, if tolerating then increase to one tablet a day. 01/03/22   Jertson, Jill Evelyn, MD  metroNIDAZOLE  (METROGEL ) 0.75 % vaginal gel Place 1 Applicatorful vaginally at bedtime. Use x 5 nights. 01/03/22   Jertson, Jill Evelyn, MD  Omega-3 Fatty Acids (FISH OIL PO) Take by mouth.    [provider]  triamcinolone  cream (KENALOG ) 0.1 % Use twice daily x 2 weeks, then twice weekly 08/23/20   Melvenia Sor, NP  valACYclovir  (VALTREX ) 500 MG tablet Take one  tablet po BID x 3 days prn 01/03/22   Jertson, Jill Evelyn, MD    Allergies: Patient has no known allergies.    Review of Systems  Genitourinary:  Positive for vaginal bleeding.    Updated Vital Signs BP (!) 139/98 (BP Location: Right Arm)   Pulse 93   Temp 98.4 F (36.9 C) (Oral)   Resp 18   LMP 05/11/2024 (Approximate)   SpO2 99%   Physical Exam Vitals and nursing note reviewed. Exam conducted with a chaperone present.  Constitutional:      General: She is not in acute distress.    Appearance: She is well-developed.  HENT:     Head: Normocephalic and atraumatic.  Eyes:     Conjunctiva/sclera: Conjunctivae normal.  Cardiovascular:     Rate and Rhythm: Normal rate and regular rhythm.     Heart sounds: No murmur heard. Pulmonary:     Effort: Pulmonary effort is normal. No respiratory distress.     Breath sounds: Normal breath sounds.  Abdominal:     Palpations: Abdomen is soft.     Tenderness: There is no abdominal tenderness.  Genitourinary:    General: Normal vulva.     Vagina: No vaginal discharge.     Comments: On speculum exam cervix visualized. No erythema noted. Blood noted in vaginal canal. No clots noted.  Musculoskeletal:        General: No swelling.     Cervical back: Neck supple.  Skin:    General: Skin is warm and dry.  Capillary Refill: Capillary refill takes less than 2 seconds.  Neurological:     Mental Status: She is alert.  Psychiatric:        Mood and Affect: Mood normal.     (all labs ordered are listed, but only abnormal results are displayed) Labs Reviewed  CBC WITH DIFFERENTIAL/PLATELET  PREGNANCY, URINE  BASIC METABOLIC PANEL WITH GFR    EKG: None  Radiology: No results found.   Procedures   Medications Ordered in the ED - No data to display                                  Medical Decision Making Amount and/or Complexity of Data Reviewed Labs: ordered.   This patient presents to the ED for concern of vaginal  bleeding, this involves a number of treatment options, and is a complaint that carries with it a moderate risk of complications and morbidity. A differential diagnosis was considered for the patient's symptoms which is discussed below:   Polyps vs fibroids vs pregnancy bleeding vs ovulatory bleeding   Co morbidities: Discussed in HPI   Brief History:  39 year old female presented to the ED for 2 days of vaginal bleeding in between periods.  Patient endorsed regular periods every 28 days.  Patient now experiencing 2 days of vaginal bleeding including some small clots.   EMR reviewed including pt PMHx, past surgical history and past visits to ER.   See HPI for more details   Lab Tests:   I personally reviewed all laboratory work and imaging. Metabolic panel without any acute abnormality specifically kidney function within normal limits and no significant electrolyte abnormalities. CBC without leukocytosis or significant anemia.    Consults/Attending Physician   I discussed this case with my attending physician who cosigned this note including patient's presenting symptoms, physical exam, and planned diagnostics and interventions. Attending physician stated agreement with plan or made changes to plan which were implemented.   Reevaluation:  After the interventions noted above I re-evaluated patient and found that they have :stayed the same   Problem List / ED Course:  39 yo female presented to ED complaining of vaginal bleeding x 2 day. Patient stated she has regular periods every 28 days and does not expect next period for 12 days.  Patient describes bleeding as painless and passing clots.  History significant for tubal ligation in 2016, pregnancy in 2002, and history of chlamydia more than 10 years ago.  Also family history for fibroids. Shared decision making discussion regarding STI swab. Patient believes she is low risk for STIs. As symptoms are not consistent with STI, no  swabs were taken. Patient is hemodynamically stable. CBC shows no anemias. No concern for pregnancy related bleeding given negative urine preg. I believe uterine bleeding likely caused by structural abnormality such as a fibroid or polyp vs ovulatory bleeding. I believe patient is safe for discharge with instructions for further workup with outpatient OBGYN. Plan discussed with patient and she agrees.   Dispostion:  After consideration of the diagnostic results and the patients response to treatment, I feel that the patent would benefit from discharge, follow up with OBGYN, and return precautions.       Final diagnoses:  Vaginal bleeding between periods    ED Discharge Orders     None          Harold Tillman ONEIDA DEVONNA 05/25/24 1301    Ula Prentice SAUNDERS,  MD 05/25/24 1542  "

## 2024-05-25 NOTE — ED Triage Notes (Signed)
 She c/o painless bleeding like a normal period, except it's too early. She is ambulatory and in no distress.

## 2024-05-25 NOTE — ED Notes (Signed)
 2 Lav tubes and 1 LT Grn tube sent to lab.

## 2024-05-26 ENCOUNTER — Telehealth: Payer: Self-pay

## 2024-05-26 NOTE — Telephone Encounter (Signed)
 Patient called this morning stating that she was seen in the ED yesterday for heavy bleeding x 2 days. Patient states that she is soaking a maxi pad every 1.5-2 hours. She denies light headedness or feeling faint. Labs were normal. Ed recommended an ultrasound. Patient has not been seen since Dr. Jertson in 2023. Please advise.

## 2024-05-26 NOTE — Telephone Encounter (Signed)
 Please schedule office visit

## 2024-05-26 NOTE — Telephone Encounter (Signed)
Encounter reviewed and closed.  Thank you.  

## 2024-05-26 NOTE — Telephone Encounter (Signed)
 Patient scheduled for 2pm on Wednesday. She was offered an appointment tomorrow but unable to come due to work.

## 2024-05-28 ENCOUNTER — Ambulatory Visit: Admitting: Obstetrics and Gynecology

## 2024-06-03 NOTE — Progress Notes (Unsigned)
 "  39 y.o. G28P1001 female here for annual exam. Married. PCP: Patient, No Pcp Per  Patient's last menstrual period was 05/11/2024 (approximate).    She reports ***. Urine sample provided: ***  Abnormal bleeding: *** Pelvic discharge or pain: *** Breast mass, nipple discharge or skin changes : ***  Sexually active: ***  Birth control: *** Last PAP:     Component Value Date/Time   DIAGPAP  01/06/2021 1230    - Negative for intraepithelial lesion or malignancy (NILM)   DIAGPAP  05/19/2019 0858    - Negative for Intraepithelial Lesions or Malignancy (NILM)   DIAGPAP  07/18/2017 0000    NEGATIVE FOR INTRAEPITHELIAL LESIONS OR MALIGNANCY.   DIAGPAP TRICHOMONAS VAGINALIS PRESENT. 07/18/2017 0000   HPVHIGH Negative 05/19/2019 0858   ADEQPAP  01/06/2021 1230    Satisfactory for evaluation; transformation zone component PRESENT.   ADEQPAP  05/19/2019 0858    Satisfactory for evaluation; transformation zone component PRESENT.   ADEQPAP  07/18/2017 0000    Satisfactory for evaluation  endocervical/transformation zone component PRESENT.   Gardasil: ***  Exercising: *** Smoker: ***       GYN HISTORY: ***  OB History  Gravida Para Term Preterm AB Living  1 1 1   1   SAB IAB Ectopic Multiple Live Births          # Outcome Date GA Lbr Len/2nd Weight Sex Type Anes PTL Lv  1 Term            Past Medical History:  Diagnosis Date   Genital herpes    STD (sexually transmitted disease)    chlamydia   Past Surgical History:  Procedure Laterality Date   TUBAL LIGATION     Medications Ordered Prior to Encounter[1] Social History   Socioeconomic History   Marital status: Married    Spouse name: Not on file   Number of children: Not on file   Years of education: Not on file   Highest education level: Not on file  Occupational History   Not on file  Tobacco Use   Smoking status: Every Day    Types: Cigarettes   Smokeless tobacco: Never  Vaping Use   Vaping status:  Never Used  Substance and Sexual Activity   Alcohol use: Yes    Comment: a pint a day   Drug use: Yes    Types: Marijuana   Sexual activity: Yes    Birth control/protection: Surgical    Comment: BTL   Other Topics Concern   Not on file  Social History Narrative   Not on file   Social Drivers of Health   Tobacco Use: High Risk (05/25/2024)   Patient History    Smoking Tobacco Use: Every Day    Smokeless Tobacco Use: Never    Passive Exposure: Not on file  Financial Resource Strain: Not on file  Food Insecurity: Not on file  Transportation Needs: Not on file  Physical Activity: Not on file  Stress: Not on file  Social Connections: Not on file  Intimate Partner Violence: Unknown (10/16/2021)   Received from [facility]   Intimate Partner Violence    Fear of Current or Ex-Partner: Not on file  Depression (PHQ2-9): Not on file  Alcohol Screen: Not on file  Housing: Not on file  Utilities: Not on file  Health Literacy: Not on file   Family History  Problem Relation Age of Onset   Diabetes Paternal Grandfather    Hypertension Paternal Actor  Allergies[2]  PE There were no vitals filed for this visit. There is no height or weight on file to calculate BMI.  Physical Exam    Assessment and Plan:        There are no diagnoses linked to this encounter. Clotilda FORBES Pa, CMA      [1]  Current Outpatient Medications on File Prior to Visit  Medication Sig Dispense Refill   citalopram  (CELEXA ) 20 MG tablet TAKE 1/2 a TABLET po  a qd for one week, if tolerating then increase to one tablet a day. 90 tablet 3   metroNIDAZOLE  (METROGEL ) 0.75 % vaginal gel Place 1 Applicatorful vaginally at bedtime. Use x 5 nights. 70 g 0   Omega-3 Fatty Acids (FISH OIL PO) Take by mouth.     triamcinolone  cream (KENALOG ) 0.1 % Use twice daily x 2 weeks, then twice weekly 30 g 0   valACYclovir  (VALTREX ) 500 MG tablet Take one tablet po BID x 3 days prn 30 tablet 1   No current  facility-administered medications on file prior to visit.  [2] No Known Allergies  "

## 2024-06-05 ENCOUNTER — Ambulatory Visit: Admitting: Obstetrics and Gynecology

## 8723-07-07 DEATH — deceased
